# Patient Record
Sex: Male | Born: 1995 | Race: White | Hispanic: No | Smoking: Former smoker
Health system: Southern US, Community
[De-identification: ages and names within clinical notes are randomized; demographics above are authoritative.]

## PROBLEM LIST (undated history)

## (undated) DIAGNOSIS — K000 Anodontia: Secondary | ICD-10-CM

## (undated) DIAGNOSIS — Z8659 Personal history of other mental and behavioral disorders: Secondary | ICD-10-CM

## (undated) HISTORY — DX: Personal history of other mental and behavioral disorders: Z86.59

## (undated) HISTORY — DX: Anodontia: K00.0

## (undated) SURGERY — Surgical Case
Anesthesia: *Unknown

---
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
## 2016-09-29 ENCOUNTER — Encounter (HOSPITAL_COMMUNITY)
Admission: EM | Disposition: A | Discharge status: Home / Self Care | Payer: Self-pay | Source: Home / Self Care | Attending: Emergency Medicine

## 2016-09-29 ENCOUNTER — Emergency Department (HOSPITAL_COMMUNITY): Payer: Self-pay

## 2016-09-29 ENCOUNTER — Emergency Department (HOSPITAL_COMMUNITY): Payer: Self-pay | Admitting: Certified Registered"

## 2016-09-29 ENCOUNTER — Ambulatory Visit (HOSPITAL_COMMUNITY)
Admission: EM | Admit: 2016-09-29 | Discharge: 2016-09-30 | Disposition: A | Discharge status: Home / Self Care | Payer: Self-pay | Attending: Emergency Medicine | Admitting: Emergency Medicine

## 2016-09-29 DIAGNOSIS — S62396A Other fracture of fifth metacarpal bone, right hand, initial encounter for closed fracture: Secondary | ICD-10-CM

## 2016-09-29 DIAGNOSIS — Z91048 Other nonmedicinal substance allergy status: Secondary | ICD-10-CM | POA: Insufficient documentation

## 2016-09-29 DIAGNOSIS — Y9389 Activity, other specified: Secondary | ICD-10-CM | POA: Insufficient documentation

## 2016-09-29 DIAGNOSIS — Y998 Other external cause status: Secondary | ICD-10-CM | POA: Insufficient documentation

## 2016-09-29 DIAGNOSIS — F17200 Nicotine dependence, unspecified, uncomplicated: Secondary | ICD-10-CM | POA: Insufficient documentation

## 2016-09-29 DIAGNOSIS — Z79899 Other long term (current) drug therapy: Secondary | ICD-10-CM | POA: Insufficient documentation

## 2016-09-29 DIAGNOSIS — Y9289 Other specified places as the place of occurrence of the external cause: Secondary | ICD-10-CM | POA: Insufficient documentation

## 2016-09-29 DIAGNOSIS — Z91010 Allergy to peanuts: Secondary | ICD-10-CM | POA: Insufficient documentation

## 2016-09-29 DIAGNOSIS — W2209XA Striking against other stationary object, initial encounter: Secondary | ICD-10-CM | POA: Insufficient documentation

## 2016-09-29 DIAGNOSIS — F909 Attention-deficit hyperactivity disorder, unspecified type: Secondary | ICD-10-CM | POA: Insufficient documentation

## 2016-09-29 DIAGNOSIS — S62324A Displaced fracture of shaft of fourth metacarpal bone, right hand, initial encounter for closed fracture: Secondary | ICD-10-CM | POA: Insufficient documentation

## 2016-09-29 DIAGNOSIS — Z09 Encounter for follow-up examination after completed treatment for conditions other than malignant neoplasm: Secondary | ICD-10-CM

## 2016-09-29 DIAGNOSIS — S62336A Displaced fracture of neck of fifth metacarpal bone, right hand, initial encounter for closed fracture: Secondary | ICD-10-CM | POA: Insufficient documentation

## 2016-09-29 HISTORY — PX: PERCUTANEOUS PINNING: SHX2209

## 2016-09-29 SURGERY — PINNING, EXTREMITY, PERCUTANEOUS
Anesthesia: General | Site: Hand | Laterality: Right

## 2016-09-29 MED ORDER — MIDAZOLAM HCL 2 MG/2ML IJ SOLN
INTRAMUSCULAR | Status: AC
  Filled 2016-09-29: qty 2

## 2016-09-29 MED ORDER — 0.9 % SODIUM CHLORIDE (POUR BTL) OPTIME
TOPICAL | Status: DC | PRN
  Administered 2016-09-29: 1000 mL

## 2016-09-29 MED ORDER — PROPOFOL 10 MG/ML IV BOLUS
INTRAVENOUS | Status: AC
  Filled 2016-09-29: qty 20

## 2016-09-29 MED ORDER — FENTANYL CITRATE (PF) 250 MCG/5ML IJ SOLN
INTRAMUSCULAR | Status: AC
  Filled 2016-09-29: qty 5

## 2016-09-29 MED ORDER — CEFAZOLIN SODIUM-DEXTROSE 2-4 GM/100ML-% IV SOLN
INTRAVENOUS | Status: AC
  Filled 2016-09-29: qty 100

## 2016-09-29 SURGICAL SUPPLY — 31 items
BANDAGE ACE 3X5.8 VEL STRL LF (GAUZE/BANDAGES/DRESSINGS) ×3 IMPLANT
BANDAGE ACE 4X5 VEL STRL LF (GAUZE/BANDAGES/DRESSINGS) ×3 IMPLANT
BLADE CLIPPER SURG (BLADE) IMPLANT
BNDG COHESIVE 4X5 TAN STRL (GAUZE/BANDAGES/DRESSINGS) ×3 IMPLANT
BNDG GAUZE ELAST 4 BULKY (GAUZE/BANDAGES/DRESSINGS) ×3 IMPLANT
CHLORAPREP W/TINT 10.5 ML (MISCELLANEOUS) ×3 IMPLANT
COVER SURGICAL LIGHT HANDLE (MISCELLANEOUS) ×3 IMPLANT
CUFF TOURN SGL QUICK 18 (TOURNIQUET CUFF) ×3 IMPLANT
DRAPE C-ARM 42X120 X-RAY (DRAPES) ×3 IMPLANT
DRAPE SURG 17X11 SM STRL (DRAPES) ×3 IMPLANT
DRESSING DUODERM 4X4 STERILE (GAUZE/BANDAGES/DRESSINGS) ×3 IMPLANT
DRSG ADAPTIC 3X8 NADH LF (GAUZE/BANDAGES/DRESSINGS) ×3 IMPLANT
GAUZE SPONGE 4X4 12PLY STRL (GAUZE/BANDAGES/DRESSINGS) ×3 IMPLANT
GAUZE XEROFORM 1X8 LF (GAUZE/BANDAGES/DRESSINGS) ×3 IMPLANT
GLOVE BIO SURGEON STRL SZ7.5 (GLOVE) ×3 IMPLANT
GLOVE BIOGEL PI IND STRL 8 (GLOVE) ×1 IMPLANT
GLOVE BIOGEL PI INDICATOR 8 (GLOVE) ×2
GOWN STRL REUS W/ TWL LRG LVL3 (GOWN DISPOSABLE) ×1 IMPLANT
GOWN STRL REUS W/TWL LRG LVL3 (GOWN DISPOSABLE) ×2
KIT BASIN OR (CUSTOM PROCEDURE TRAY) ×3 IMPLANT
KWIRE 1.1 (Wire) ×6 IMPLANT
MANIFOLD NEPTUNE II (INSTRUMENTS) ×3 IMPLANT
NS IRRIG 1000ML POUR BTL (IV SOLUTION) ×3 IMPLANT
PACK ORTHO EXTREMITY (CUSTOM PROCEDURE TRAY) ×3 IMPLANT
PAD CAST 4YDX4 CTTN HI CHSV (CAST SUPPLIES) ×1 IMPLANT
PADDING CAST COTTON 4X4 STRL (CAST SUPPLIES) ×2
SOAP 2 % CHG 4 OZ (WOUND CARE) ×3 IMPLANT
SUT CHROMIC 6 0 PS 4 (SUTURE) ×3 IMPLANT
SUT VICRYL RAPIDE 4/0 PS 2 (SUTURE) ×3 IMPLANT
TOWEL OR 17X26 10 PK STRL BLUE (TOWEL DISPOSABLE) ×3 IMPLANT
WATER STERILE IRR 1000ML POUR (IV SOLUTION) IMPLANT
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
## 2016-09-29 NOTE — ED Notes (Signed)
Pt has PMS to right hand 4/10 pain. Pt denies any numbness or tingling.
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
## 2016-09-29 NOTE — Anesthesia Preprocedure Evaluation (Signed)
Anesthesia Evaluation  Patient identified by MRN, date of birth, ID band Patient awake    Reviewed: Allergy & Precautions, NPO status , Patient's Chart, lab work & pertinent test results  Airway Mallampati: II  TM Distance: >3 FB Neck ROM: Full    Dental  (+) Missing, Dental Advisory Given,    Pulmonary neg pulmonary ROS,    breath sounds clear to auscultation       Cardiovascular negative cardio ROS   Rhythm:Regular Rate:Normal     Neuro/Psych negative neurological ROS  negative psych ROS   GI/Hepatic negative GI ROS, Neg liver ROS,   Endo/Other  negative endocrine ROS  Renal/GU negative Renal ROS     Musculoskeletal negative musculoskeletal ROS (+)   Abdominal   Peds  Hematology negative hematology ROS (+)   Anesthesia Other Findings - ADHD  Reproductive/Obstetrics                             Anesthesia Physical Anesthesia Plan  ASA: II and emergent  Anesthesia Plan: General   Post-op Pain Management:    Induction: Intravenous  PONV Risk Score and Plan: 3 and Ondansetron, Dexamethasone, Propofol and Midazolam  Airway Management Planned: Oral ETT  Additional Equipment:   Intra-op Plan:   Post-operative Plan: Extubation in OR  Informed Consent: I have reviewed the patients History and Physical, chart, labs and discussed the procedure including the risks, benefits and alternatives for the proposed anesthesia with the patient or authorized representative who has indicated his/her understanding and acceptance.   Dental advisory given  Plan Discussed with: CRNA  Anesthesia Plan Comments:         Anesthesia Quick Evaluation

## 2016-09-29 NOTE — Discharge Instructions (Addendum)
General Anesthesia, Adult, Care After These instructions provide you with information about caring for yourself after your procedure. Your health care provider may also give you more specific instructions. Your treatment has been planned according to current medical practices, but problems sometimes occur. Call your health care provider if you have any problems or questions after your procedure. What can I expect after the procedure? After the procedure, it is common to have:  Vomiting.  A sore throat.  Mental slowness.  It is common to feel:  Nauseous.  Cold or shivery.  Sleepy.  Tired.  Sore or achy, even in parts of your body where you did not have surgery.  Follow these instructions at home: For at least 24 hours after the procedure:  Do not: ? Participate in activities where you could fall or become injured. ? Drive. ? Use heavy machinery. ? Drink alcohol. ? Take sleeping pills or medicines that cause drowsiness. ? Make important decisions or sign legal documents. ? Take care of children on your own.  Rest. Eating and drinking  If you vomit, drink water, juice, or soup when you can drink without vomiting.  Drink enough fluid to keep your urine clear or pale yellow.  Make sure you have little or no nausea before eating solid foods.  Follow the diet recommended by your health care provider. General instructions  Have a responsible adult stay with you until you are awake and alert.  Return to your normal activities as told by your health care provider. Ask your health care provider what activities are safe for you.  Take over-the-counter and prescription medicines only as told by your health care provider.  If you smoke, do not smoke without supervision.  Keep all follow-up visits as told by your health care provider. This is important. Contact a health care provider if:  You continue to have nausea or vomiting at home, and medicines are not  helpful.  You cannot drink fluids or start eating again.  You cannot urinate after 8-12 hours.  You develop a skin rash.  You have fever.  You have increasing redness at the site of your procedure. Get help right away if:  You have difficulty breathing.  You have chest pain.  You have unexpected bleeding.  You feel that you are having a life-threatening or urgent problem. This information is not intended to replace advice given to you by your health care provider. Make sure you discuss any questions you have with your health care provider. Document Released: 06/08/2000 Document Revised: 08/05/2015 Document Reviewed: 02/14/2015 Elsevier Interactive Patient Education  2018 [employer] Discharge Instructions   You have a dressing with a plaster splint incorporated in it. Move your fingers as much as possible, making a full fist and fully opening the fist. Elevate your hand to reduce pain & swelling of the digits.  Ice over the operative site may be helpful to reduce pain & swelling.  DO NOT USE HEAT. Leave the dressing in place until you return to our office.  You may shower, but keep the bandage clean & dry.  You may drive a car when you are off of prescription pain medications and can safely control your vehicle with both hands.    Please call [phone] during normal business hours or [phone] after hours for any problems. Including the following:  - excessive redness of the incisions - drainage for more than 4 days - fever of more than 101.5 F  *Please note that pain medications will  not be refilled after hours or on weekends.

## 2016-09-30 ENCOUNTER — Encounter (HOSPITAL_COMMUNITY): Payer: Self-pay | Admitting: Orthopedic Surgery

## 2016-09-30 ENCOUNTER — Emergency Department (HOSPITAL_COMMUNITY): Payer: Self-pay

## 2016-09-30 MED ORDER — ONDANSETRON HCL 4 MG/2ML IJ SOLN
INTRAMUSCULAR | Status: AC
  Filled 2016-09-30: qty 4

## 2016-09-30 MED ORDER — DEXAMETHASONE SODIUM PHOSPHATE 10 MG/ML IJ SOLN
INTRAMUSCULAR | Status: DC | PRN
  Administered 2016-09-30: 10 mg via INTRAVENOUS

## 2016-09-30 MED ORDER — LACTATED RINGERS IV SOLN
INTRAVENOUS | Status: DC
Start: 2016-09-30 — End: 2016-09-30

## 2016-09-30 MED ORDER — ONDANSETRON HCL 4 MG/2ML IJ SOLN
INTRAMUSCULAR | Status: DC | PRN
  Administered 2016-09-30: 4 mg via INTRAVENOUS

## 2016-09-30 MED ORDER — PHENYLEPHRINE 40 MCG/ML (10ML) SYRINGE FOR IV PUSH (FOR BLOOD PRESSURE SUPPORT)
PREFILLED_SYRINGE | INTRAVENOUS | Status: AC
  Filled 2016-09-30: qty 20

## 2016-09-30 MED ORDER — PROPOFOL 10 MG/ML IV BOLUS
INTRAVENOUS | Status: DC | PRN
  Administered 2016-09-30: 200 mg via INTRAVENOUS

## 2016-09-30 MED ORDER — HYDROMORPHONE HCL-NACL 0.5-0.9 MG/ML-% IV SOSY: 0.2500 mg | PREFILLED_SYRINGE | INTRAVENOUS | Status: DC | PRN

## 2016-09-30 MED ORDER — MEPERIDINE HCL 50 MG/ML IJ SOLN: 6.2500 mg | INTRAMUSCULAR | Status: DC | PRN

## 2016-09-30 MED ORDER — ACETAMINOPHEN 325 MG PO TABS: 650.0000 mg | ORAL_TABLET | Freq: Four times a day (QID) | ORAL | Status: DC | PRN

## 2016-09-30 MED ORDER — OXYCODONE HCL 5 MG PO TABS: 5.0000 mg | ORAL_TABLET | Freq: Four times a day (QID) | ORAL | 0 refills | Status: DC | PRN

## 2016-09-30 MED ORDER — MIDAZOLAM HCL 2 MG/2ML IJ SOLN
INTRAMUSCULAR | Status: DC | PRN
  Administered 2016-09-29: 2 mg via INTRAVENOUS

## 2016-09-30 MED ORDER — BUPIVACAINE-EPINEPHRINE (PF) 0.5% -1:200000 IJ SOLN
INTRAMUSCULAR | Status: AC
  Filled 2016-09-30: qty 30

## 2016-09-30 MED ORDER — BUPIVACAINE-EPINEPHRINE (PF) 0.5% -1:200000 IJ SOLN
INTRAMUSCULAR | Status: DC | PRN
  Administered 2016-09-30: 10 mL

## 2016-09-30 MED ORDER — PROMETHAZINE HCL 25 MG/ML IJ SOLN: 6.2500 mg | INTRAMUSCULAR | Status: DC | PRN

## 2016-09-30 MED ORDER — SUCCINYLCHOLINE CHLORIDE 200 MG/10ML IV SOSY
PREFILLED_SYRINGE | INTRAVENOUS | Status: DC | PRN
  Administered 2016-09-30: 100 mg via INTRAVENOUS

## 2016-09-30 MED ORDER — EPHEDRINE 5 MG/ML INJ
INTRAVENOUS | Status: AC
  Filled 2016-09-30: qty 10

## 2016-09-30 MED ORDER — LACTATED RINGERS IV SOLN
INTRAVENOUS | Status: DC | PRN
  Administered 2016-09-29: via INTRAVENOUS

## 2016-09-30 MED ORDER — FENTANYL CITRATE (PF) 100 MCG/2ML IJ SOLN
INTRAMUSCULAR | Status: AC
  Filled 2016-09-30: qty 2

## 2016-09-30 MED ORDER — MEPERIDINE HCL 50 MG/ML IJ SOLN
INTRAMUSCULAR | Status: AC
  Filled 2016-09-30: qty 1

## 2016-09-30 MED ORDER — LIDOCAINE 2% (20 MG/ML) 5 ML SYRINGE
INTRAMUSCULAR | Status: DC | PRN
  Administered 2016-09-30: 60 mg via INTRAVENOUS

## 2016-09-30 MED ORDER — FENTANYL CITRATE (PF) 250 MCG/5ML IJ SOLN
INTRAMUSCULAR | Status: DC | PRN
  Administered 2016-09-29 – 2016-09-30 (×2): 100 ug via INTRAVENOUS
  Administered 2016-09-30: 50 ug via INTRAVENOUS

## 2016-09-30 MED ORDER — IBUPROFEN 200 MG PO TABS: 600.0000 mg | ORAL_TABLET | Freq: Four times a day (QID) | ORAL | Status: DC | PRN

## 2016-09-30 MED ORDER — CEFAZOLIN SODIUM-DEXTROSE 2-3 GM-% IV SOLR
INTRAVENOUS | Status: DC | PRN
  Administered 2016-09-30: 2 g via INTRAVENOUS

## 2016-09-30 MED ORDER — LIDOCAINE 2% (20 MG/ML) 5 ML SYRINGE
INTRAMUSCULAR | Status: AC
  Filled 2016-09-30: qty 5

## 2016-09-30 MED ORDER — SUCCINYLCHOLINE CHLORIDE 200 MG/10ML IV SOSY
PREFILLED_SYRINGE | INTRAVENOUS | Status: AC
  Filled 2016-09-30: qty 20

## 2016-09-30 MED ORDER — FENTANYL CITRATE (PF) 100 MCG/2ML IJ SOLN
INTRAMUSCULAR | Status: DC | PRN
  Administered 2016-09-30 (×2): 50 ug via INTRAVENOUS

## 2016-09-30 NOTE — Anesthesia Procedure Notes (Signed)
Procedure Name: Intubation Date/Time: 09/30/2016 12:04 AM Performed by: Minerva EndsMIRARCHI, Shrinika Blatz M Pre-anesthesia Checklist: Patient identified, Emergency Drugs available, Suction available and Patient being monitored Patient Re-evaluated:Patient Re-evaluated prior to induction Oxygen Delivery Method: Circle System Utilized Preoxygenation: Pre-oxygenation with 100% oxygen Induction Type: IV induction Ventilation: Mask ventilation without difficulty Laryngoscope Size: Miller and 2 Grade View: Grade I Tube type: Oral Number of attempts: 1 Airway Equipment and Method: Stylet Placement Confirmation: ETT inserted through vocal cords under direct vision,  positive ETCO2 and breath sounds checked- equal and bilateral Secured at: 22 cm Tube secured with: Tape Dental Injury: Teeth and Oropharynx as per pre-operative assessment  Comments: Smooth IV induction Hollis--- intubation AM CRNA-  Atraumatic--- teeth and mouth as preop--- many missing teeth and front teeth with irregular surfaces--- bilat BS Hollis

## 2016-09-30 NOTE — Op Note (Signed)
09/29/2016 - 09/30/2016  1:06 AM  PATIENT:  Evan Deleon  21 y.o. male  PRE-OPERATIVE DIAGNOSIS:  Displaced fractures of the right fourth and fifth metacarpals  POST-OPERATIVE DIAGNOSIS:  Same  PROCEDURE:   1.  ORIF right fourth metacarpal fracture    2.  CRPP right fifth metacarpal fracture  SURGEON: Cliffton Astersavid A. Janee Mornhompson, MD  PHYSICIAN ASSISTANT: None  ANESTHESIA:  general  SPECIMENS:  None  DRAINS:   None  EBL:  less than 50 mL  PREOPERATIVE INDICATIONS:  Evan Festerristan Kawa is a  21 y.o. male with displaced fractures of the right fourth and fifth metacarpals.  The risks benefits and alternatives were discussed with the patient preoperatively including but not limited to the risks of infection, bleeding, nerve injury, cardiopulmonary complications, the need for revision surgery, among others, and the patient verbalized understanding and consented to proceed.  OPERATIVE IMPLANTS: 0.062 inch K wire, 0.045 inch K wires 2  OPERATIVE PROCEDURE:  After receiving prophylactic antibiotics, the patient was escorted to the operative theatre and placed in a supine position.  General anesthesia was administered.  A surgical "time-out" was performed during which the planned procedure, proposed operative site, and the correct patient identity were compared to the operative consent and agreement confirmed by the circulating nurse according to current facility policy.  Following application of a tourniquet to the operative extremity, the exposed skin was prepped with Chloraprep and draped in the usual sterile fashion.  The tourniquet was never inflated.  Using fluoroscopic guidance, the base of the fourth metacarpal was identified and a small incision made proximal to it.  Spreading dissection was carried out of the dorsal cortex of the fourth metacarpal base where a 0.062 inch K wire was drilled obliquely to gain access to the central canal.  It was then removed, the N clipped and bent into a skid shape  and it was advanced by hand to the fracture site.  Multiple attempts were made at closed reduction and passing of the nail across the fracture site, and they were unsuccessful.  A 1.5 cm incision was made over the fracture site, spreading dissection carried down to it, and a clamp used to control the distal fragment.  In this manner, the nail was passed across the fracture site and into the metacarpal head.  The fracture was impacted to obtain better reduction and proximally it was bent over and clipped.  A small relaxing incision was made and a portion of the original incision closed with a single 4-0 Vicryl Rapide interrupted suture.  The open reduction site was closed similarly.  The fifth metacarpal neck fracture was then reduced in a closed fashion and the reduction was secured with crossed 0.045 inch K wires it exited ulnarly.  These wires were bent over 90.  Final fluoroscopic images were obtained.  Short arm splint dressing was applied with a plaster component both volarly and ulnarly.  Ulnar nerve block at the elbow was performed by me with half percent Marcaine with epinephrine.  He was awakened and taken to the recovery room stable condition, breathing spontaneously.  DISPOSITION: He'll be discharged home with typical instructions, returning in 10-15 days, with new x-rays of the right hand out of splint and likely application of short arm cast.

## 2016-09-30 NOTE — Anesthesia Procedure Notes (Signed)
Date/Time: 09/30/2016 12:59 AM Performed by: Minerva EndsMIRARCHI, Dyquan Minks M Oxygen Delivery Method: Simple face mask Placement Confirmation: breath sounds checked- equal and bilateral and positive ETCO2 Comments: Extubated--- good AW --- OA present and removed--- teeth unchanged--- mask 02 to PACU on O2

## 2016-09-30 NOTE — Transfer of Care (Signed)
Immediate Anesthesia Transfer of Care Note  Patient: Evan Deleon  Procedure(s) Performed: Procedure(s): PERCUTANEOUS PINNING EXTREMITY/RIGHT 4TH AND 5TH METACARPAL (Right)  Patient Location: PACU  Anesthesia Type:General  Level of Consciousness: awake and alert   Airway & Oxygen Therapy: Patient Spontanous Breathing and Patient connected to face mask oxygen  Post-op Assessment: Report given to RN and Post -op Vital signs reviewed and stable  Post vital signs: Reviewed and stable  Last Vitals:  Vitals:   09/29/16 2148  Pulse: 79  Resp: 20  Temp: 36.7 C    Last Pain:  Vitals:   09/29/16 2156  TempSrc:   PainSc: 4          Complications: No apparent anesthesia complications

## 2016-09-30 NOTE — Anesthesia Postprocedure Evaluation (Signed)
Anesthesia Post Note  Patient: Evan Deleon  Procedure(s) Performed: Procedure(s) (LRB): PERCUTANEOUS PINNING EXTREMITY/RIGHT 4TH AND 5TH METACARPAL (Right)     Patient location during evaluation: PACU Anesthesia Type: General Level of consciousness: awake and alert Pain management: pain level controlled Vital Signs Assessment: post-procedure vital signs reviewed and stable Respiratory status: spontaneous breathing, nonlabored ventilation, respiratory function stable and patient connected to nasal cannula oxygen Cardiovascular status: blood pressure returned to baseline and stable Postop Assessment: no signs of nausea or vomiting Anesthetic complications: no    Last Vitals:  Vitals:   09/30/16 0106 09/30/16 0115  BP: 140/70 (!) 145/85  Pulse: (!) 105 (!) 104  Resp: 18 16  Temp: 36.7 C     Last Pain:  Vitals:   09/29/16 2156  TempSrc:   PainSc: 4                  Shelton SilvasKevin D Gaige Fussner

## 2016-12-14 ENCOUNTER — Encounter: Payer: Self-pay | Admitting: Internal Medicine

## 2016-12-14 ENCOUNTER — Ambulatory Visit (INDEPENDENT_AMBULATORY_CARE_PROVIDER_SITE_OTHER): Payer: Self-pay | Admitting: Internal Medicine

## 2016-12-14 VITALS — BP 128/76 | HR 86 | Resp 14 | Ht 66.5 in | Wt 164.0 lb

## 2016-12-14 DIAGNOSIS — Z9189 Other specified personal risk factors, not elsewhere classified: Secondary | ICD-10-CM

## 2016-12-14 DIAGNOSIS — Z8659 Personal history of other mental and behavioral disorders: Secondary | ICD-10-CM | POA: Insufficient documentation

## 2016-12-14 DIAGNOSIS — K000 Anodontia: Secondary | ICD-10-CM

## 2016-12-14 DIAGNOSIS — H5213 Myopia, bilateral: Secondary | ICD-10-CM

## 2016-12-14 HISTORY — DX: Personal history of other mental and behavioral disorders: Z86.59

## 2016-12-14 MED ORDER — CETIRIZINE HCL 10 MG PO TABS: 10.0000 mg | ORAL_TABLET | Freq: Every day | ORAL | 11 refills | Status: DC

## 2016-12-14 NOTE — Patient Instructions (Signed)
Tobacco Cessation:   [id number] or [phone], the former for support and possibly free nicotine patches/gum and support; the latter for [facility] Smoking cessation class. Get rid of all smoking supplies:  Cigarettes, lighters, ashtrays--no stashes just in case at home if you are serious.  For nicotine patches:  Stop smoking anything the day you start the first patch Start with 21 mg patch and reapply new to different area of skin every 24 hours for 30 days. Then 14 mg patch changed every 24 hours for 14 days. Then 7 mg patch changed every 24 hours for 14 days.  

## 2016-12-14 NOTE — Progress Notes (Signed)
   Subjective:    Patient ID: Evan Deleon, male    DOB: [birth year], 21 y.o.   MRN: [id number]  HPI   Here to establish  1.  Dental Issues:  No pain or particular problems.  Just has not been to dentist in years.  No cavities he is aware of.  2.  Myopia:  Has worn glasses for 5 years.  Last check was about 2 years ago.  Feels his vision has changed with his current lenses.  3.  Seasonal allergies:  Currently using Claritin.  Has never used Zyrtec.  Spring is the worst.  No outpatient prescriptions have been marked as taking for the 12/14/16 encounter (Office Visit) with Julieanne Manson, MD.    Allergies  Allergen Reactions  . Peanuts [Peanut Oil] Swelling    Black walnuts Facial swelling  . Pollen Extract Other (See Comments)    sneezing   Past Medical History:  Diagnosis Date  . History of ADHD 12/14/2016  . Oligodontia     Past Surgical History:  Procedure Laterality Date  . PERCUTANEOUS PINNING Right 09/29/2016   Procedure: PERCUTANEOUS PINNING EXTREMITY/RIGHT 4TH AND 5TH METACARPAL;  Surgeon: Mack Hook, MD;  Location: [facility];  Service: Orthopedics;  Laterality: Right;   History reviewed. No pertinent family history.   Social History   Social History  . Marital status: Media planner    Spouse name: [place]  . Number of children: 2  . Years of education: 12   Occupational History  . asst. manager at [employer]    Social History Main Topics  . Smoking status: Current Every Day Smoker    Packs/day: 0.50    Years: 6.00    Types: Cigarettes  . Smokeless tobacco: Never Used  . Alcohol use No  . Drug use: No  . Sexual activity: Yes    Birth control/ protection: Pill   Other Topics Concern  . Not on file   Social History Narrative   Dropped out of [employer] year, but finished GED via [employer]   Lives with girlfriend, their 2 children and his maternal grandmother.       Review of Systems     Objective:   Physical Exam  NAD HEENT:   PERRL, EOMI, TMs pearly gray, throat without injection.  Several teeth missing.  No significant dental decay Throat without injection Neck:  Supple, No adenopathy Chest:  CTA CV:  RRR with normal S1 and S2, No S3, S4 or murmur.  Radial and DP pulses normal and equal LE:  No edema.        Assessment & Plan:  1.  Myopia:  Optometry Referral and eyeglass voucher if new Rx recommended.  2.  Dental care need:  Dental referral.  Has diagnosis of oligodontia.  3.  Tobacco abuse:  Discussed available support and recommended to utilize nicotine patches--info given.

## 2016-12-14 NOTE — Progress Notes (Signed)
LCSW completed new pt screening with pt. Pt reported that he has had some anxiety over the past month, since hurting his hand. He shared that he has had very little appetite over the past month. He shared that he was on Concerta for ADHD most of his life but had to come off it when he turned 22 and lost Medicaid; he reported daily difficulties with concentration. Pt scheduled an appointment for counseling.

## 2016-12-24 ENCOUNTER — Other Ambulatory Visit: Payer: Self-pay | Admitting: Licensed Clinical Social Worker

## 2018-01-22 ENCOUNTER — Encounter (HOSPITAL_COMMUNITY): Payer: Self-pay | Admitting: Emergency Medicine

## 2018-01-22 ENCOUNTER — Other Ambulatory Visit: Payer: Self-pay

## 2018-01-22 ENCOUNTER — Emergency Department (HOSPITAL_COMMUNITY): Payer: Medicaid Other

## 2018-01-22 ENCOUNTER — Emergency Department (HOSPITAL_COMMUNITY)
Admission: EM | Admit: 2018-01-22 | Discharge: 2018-01-22 | Disposition: A | Discharge status: Home / Self Care | Payer: Medicaid Other | Attending: Emergency Medicine | Admitting: Emergency Medicine

## 2018-01-22 DIAGNOSIS — Y999 Unspecified external cause status: Secondary | ICD-10-CM | POA: Insufficient documentation

## 2018-01-22 DIAGNOSIS — Y939 Activity, unspecified: Secondary | ICD-10-CM | POA: Diagnosis not present

## 2018-01-22 DIAGNOSIS — F17210 Nicotine dependence, cigarettes, uncomplicated: Secondary | ICD-10-CM | POA: Insufficient documentation

## 2018-01-22 DIAGNOSIS — S43014A Anterior dislocation of right humerus, initial encounter: Secondary | ICD-10-CM | POA: Diagnosis not present

## 2018-01-22 DIAGNOSIS — F1290 Cannabis use, unspecified, uncomplicated: Secondary | ICD-10-CM | POA: Diagnosis not present

## 2018-01-22 DIAGNOSIS — X500XXA Overexertion from strenuous movement or load, initial encounter: Secondary | ICD-10-CM | POA: Diagnosis not present

## 2018-01-22 DIAGNOSIS — Y929 Unspecified place or not applicable: Secondary | ICD-10-CM | POA: Insufficient documentation

## 2018-01-22 MED ORDER — HYDROMORPHONE HCL 1 MG/ML IJ SOLN
1.0000 mg | Freq: Once | INTRAMUSCULAR | Status: AC
  Administered 2018-01-22: 1 mg via INTRAVENOUS
  Filled 2018-01-22: qty 1

## 2018-01-22 MED ORDER — IBUPROFEN 800 MG PO TABS: 800.0000 mg | ORAL_TABLET | Freq: Three times a day (TID) | ORAL | 0 refills | Status: DC | PRN

## 2018-01-22 NOTE — ED Provider Notes (Signed)
Emergency Department Provider Note   I have reviewed the triage vital signs and the nursing notes.   HISTORY  Chief Complaint Shoulder Pain   HPI Evan Deleon is a 22 y.o. male with PMH of ADHD presents to the emergency department for evaluation of right shoulder pain.  The patient states that his shoulder came out of joint several weeks ago and then spontaneously went back and without intervention.  He had no prior history of shoulder dislocation.  Today he was slinging a laundry basket when the right shoulder became acutely painful and he noticed some shoulder deformity.  He denies any numbness or tingling in the arm.  No pain in the elbow or wrist. Pain is severe and worse with movement.   Past Medical History:  Diagnosis Date  . History of ADHD 12/14/2016  . Oligodontia     Patient Active Problem List   Diagnosis Date Noted  . History of ADHD 12/14/2016  . Oligodontia     Past Surgical History:  Procedure Laterality Date  . PERCUTANEOUS PINNING Right 09/29/2016   Procedure: PERCUTANEOUS PINNING EXTREMITY/RIGHT 4TH AND 5TH METACARPAL;  Surgeon: Mack Hook, MD;  Location: [facility];  Service: Orthopedics;  Laterality: Right;    Allergies Peanuts [peanut oil] and Pollen extract  History reviewed. No pertinent family history.  Social History Social History   Tobacco Use  . Smoking status: Current Every Day Smoker    Packs/day: 0.50    Years: 6.00    Pack years: 3.00    Types: Cigarettes  . Smokeless tobacco: Never Used  Substance Use Topics  . Alcohol use: No  . Drug use: Yes    Types: Marijuana    Review of Systems  Constitutional: No fever/chills Eyes: No visual changes. ENT: No sore throat. Cardiovascular: Denies chest pain. Respiratory: Denies shortness of breath. Gastrointestinal: No abdominal pain.  No nausea, no vomiting.  No diarrhea.  No constipation. Genitourinary: Negative for dysuria. Musculoskeletal: Negative for back pain. Positive  right shoulder pain.  Skin: Negative for rash. Neurological: Negative for headaches, focal weakness or numbness.  10-point ROS otherwise negative.  ____________________________________________   PHYSICAL EXAM:  VITAL SIGNS: ED Triage Vitals  Enc Vitals Group     BP 01/22/18 1226 114/76     Pulse Rate 01/22/18 1226 75     Resp 01/22/18 1226 18     Temp 01/22/18 1226 97.8 F (36.6 C)     Temp src --      SpO2 01/22/18 1226 99 %     Weight 01/22/18 1222 170 lb (77.1 kg)     Pain Score 01/22/18 1222 4   Constitutional: Alert and oriented. Well appearing and in no acute distress. Eyes: Conjunctivae are normal.  Head: Atraumatic. Nose: No congestion/rhinnorhea. Mouth/Throat: Mucous membranes are moist. Neck: No stridor.   Cardiovascular: Normal rate, regular rhythm. Good peripheral circulation. Grossly normal heart sounds.   Respiratory: Normal respiratory effort.  No retractions. Lungs CTAB. Gastrointestinal: Soft and nontender. No distention.  Musculoskeletal: Positive right shoulder deformity and limited ROM. Palpable humeral head anterior to the glenoid fossa.  Neurologic:  Normal speech and language. No gross focal neurologic deficits are appreciated.  Skin:  Skin is warm, dry and intact. No rash noted.  ____________________________________________  RADIOLOGY  Dg Shoulder Right  Result Date: 01/22/2018 CLINICAL DATA:  Post reduction images of the right shoulder. EXAM: RIGHT SHOULDER - 2+ VIEW COMPARISON:  01/22/2018 at 1308 hours FINDINGS: Humeral head has been reduced into anatomic alignment  with the glenoid. No fracture. IMPRESSION: Successful reduction of the dislocated right shoulder. Electronically Signed   By: Amie Portland M.D.   On: 01/22/2018 14:35   Dg Shoulder Right  Result Date: 01/22/2018 CLINICAL DATA:  Right shoulder deformity and dislocation 1 hour prior to presentation. EXAM: RIGHT SHOULDER - 2+ VIEW COMPARISON:  None. FINDINGS: There is anterior  inferior dislocation of the humeral head with respect to the glenoid. No definite Hill-Sachs impaction or bony Bankart injury. IMPRESSION: 1. Anterior shoulder dislocation without appreciable Hill-Sachs impaction. Electronically Signed   By: Gaylyn Rong M.D.   On: 01/22/2018 13:42    ____________________________________________   PROCEDURES  Procedure(s) performed:   Reduction of dislocation Date/Time: 01/22/2018 1:59 PM Performed by: Maia Plan, MD Authorized by: Maia Plan, MD  Consent: Verbal consent obtained. Risks and benefits: risks, benefits and alternatives were discussed Consent given by: patient Required items: required blood products, implants, devices, and special equipment available Patient identity confirmed: verbally with patient Time out: Immediately prior to procedure a "time out" was called to verify the correct patient, procedure, equipment, support staff and site/side marked as required. Local anesthesia used: no  Anesthesia: Local anesthesia used: no  Sedation: Patient sedated: no  Patient tolerance: Patient tolerated the procedure well with no immediate complications Comments: Patient sitting upright in bed with counter traction placed with sheet. Steady downward pressure applied to the flexed elbow with steady external rotation. The humeral head was palpated re-locating into the glenoid fossa with resolution of pain and deformity. Patient with easier ROM after reduction.       ____________________________________________   INITIAL IMPRESSION / ASSESSMENT AND PLAN / ED COURSE  Pertinent labs & imaging results that were available during my care of the patient were reviewed by me and considered in my medical decision making (see chart for details).  Patient presents to the ED with shoulder dislocation. He was given Dilaudid for pain and tolerated manipulation of the shoulder well. We were able to reduce the right shoulder in the ED without  sedation. Normal neurovascular exam both before and after procedure. Pre and Post-reduction films reviewed. Sling applied and patient to f/u with Ortho.   At this time, I do not feel there is any life-threatening condition present. I have reviewed and discussed all results (EKG, imaging, lab, urine as appropriate), exam findings with patient. I have reviewed nursing notes and appropriate previous records.  I feel the patient is safe to be discharged home without further emergent workup. Discussed usual and customary return precautions. Patient and family (if present) verbalize understanding and are comfortable with this plan.  Patient will follow-up with their primary care provider. If they do not have a primary care provider, information for follow-up has been provided to them. All questions have been answered.  ____________________________________________  FINAL CLINICAL IMPRESSION(S) / ED DIAGNOSES  Final diagnoses:  Anterior dislocation of right shoulder, initial encounter    MEDICATIONS GIVEN DURING THIS VISIT:  Medications  HYDROmorphone (DILAUDID) injection 1 mg (1 mg Intravenous Given 01/22/18 1342)     NEW OUTPATIENT MEDICATIONS STARTED DURING THIS VISIT:  Discharge Medication List as of 01/22/2018  3:03 PM    START taking these medications   Details  ibuprofen (ADVIL,MOTRIN) 800 MG tablet Take 1 tablet (800 mg total) by mouth every 8 (eight) hours as needed for moderate pain., Starting Sat 01/22/2018, Print        Note:  This document was prepared using Dragon voice recognition software and may  include unintentional dictation errors.  Alona Bene, MD Emergency Medicine    , Arlyss Repress, MD 01/23/18 [phone]

## 2018-01-22 NOTE — ED Triage Notes (Signed)
Pt stated that he threw a heavy object with his r/arm and it dislocated his r/shoulder. Obvious deformity noted.  Incident occured approx 30 minutes ago. Pt reports a similar incident one month ago and was able to pop it back in to place. He did not seek medical attention for that event. Friend applied ice to shoulder and transported him to ED

## 2018-01-22 NOTE — ED Provider Notes (Signed)
MSE was initiated and I personally evaluated the patient and placed orders (if any) at  1:00 PM on January 22, 2018.  22 year old male presents for evaluation of right arm injury.  States he threw a Government social research officer and felt a "pop" to his right shoulder.  Patient states he does have a history of dislocating his right shoulder.  Last dislocation approximately 1 month ago.  Injury occurred at approximately 12 PM today.  Rates his pain a 10/10.  States he is unable to move the arm secondary to pain.   General: Well-appearing male.  Appears in obvious discomfort. Cardiac: Normal rate and rhythm, no rubs murmurs or gallops. 2+ bilateral radial pulses. Pulmonary: Clear to auscultation bilaterally. MS: Unable to perform ROM secondary to pain. 5/5 grip strength to bilateral upper extremity. Neuro: Intact sensation to bilateral upper extremities. Skin: Mild edema to right shoulder.  No erythema or warmth to right upper extremity.  MDM:  Patient with obvious squared off right shoulder.  Concern for dislocation.  Neurovascularly intact.  2+ radial pulses bilaterally.  Will obtain plain film and provide pain management.  The patient appears stable so that the remainder of the MSE may be completed by another provider.   Murle Hellstrom A, PA-C 01/22/18 1331    Geoffery Lyons, MD 01/22/18 1504

## 2018-01-22 NOTE — ED Notes (Signed)
I only discharged pt from lobby. 

## 2018-01-22 NOTE — ED Notes (Signed)
Bed: WA21 Expected date:  Expected time:  Means of arrival:  Comments: Triage 7 

## 2018-01-22 NOTE — Discharge Instructions (Signed)
You have been seen in the Emergency Department (ED) today for a shoulder dislocation.  It was put back in place in the ED.  Please follow up as written with the recommend orthopedic surgeon.  It is important you leave the shoulder immobilizer in place until they tell you to remove it.  If you have worsening pain or swelling, if the shoulder dislocates again, or if you have any other symptoms that concern you, please return immediately to the Emergency Department.  

## 2018-01-24 ENCOUNTER — Ambulatory Visit (INDEPENDENT_AMBULATORY_CARE_PROVIDER_SITE_OTHER): Payer: Medicaid Other | Admitting: Family Medicine

## 2018-01-24 ENCOUNTER — Encounter (INDEPENDENT_AMBULATORY_CARE_PROVIDER_SITE_OTHER): Payer: Self-pay | Admitting: Family Medicine

## 2018-01-24 DIAGNOSIS — S43014A Anterior dislocation of right humerus, initial encounter: Secondary | ICD-10-CM

## 2018-01-24 DIAGNOSIS — M25511 Pain in right shoulder: Secondary | ICD-10-CM | POA: Diagnosis not present

## 2018-01-24 NOTE — Progress Notes (Signed)
Office Visit Note   Patient: Evan Deleon           Date of Birth: 1996/01/06           MRN: [id number] Visit Date: 01/24/2018 Requested by: Julieanne Manson, MD [address] [place], [place] [address] PCP: Julieanne Manson, MD  Subjective: Chief Complaint  Patient presents with  . Right Shoulder - Pain    Dislocated right shoulder picking up a laundry basket and lifting it over the heads of his children and sliding it across the floor - 01/22/18.  Went to [facility] ED - in [place] throughout day, off at night.  Occurred 12/25/17 also, but "it fixed itself."    HPI: He is a 22 year old right-hand-dominant male with right shoulder dislocation.  2 days ago he lifted a laundry basket overhead and then set it down to the left.  He felt his shoulder slip out of place and it would not come back in.  He went to the ER where he underwent closed reduction under light anesthesia.  He has been in a shoulder sling since then, feels very well, hardly having any discomfort.  About a month before he recalls throwing a ball and feeling like his shoulder was slipping out of place, but it never came out completely.  Prior to that, he never had any troubles with his shoulder.  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  He denies any numbness or tingling in his arm today.              ROS: Otherwise noncontributory  Objective: Vital Signs: There were no vitals taken for this visit.  Physical Exam:  Right shoulder: Shoulder appears symmetric compared to the left, palpation is symmetric as well.  He has no significant pain to palpation today.  No numbness in the deltoid area or in his wrist and hand.  Neurovascularly intact in the upper extremity.  Active range of motion is almost full.  Imaging: None today.  Hospital x-rays were reviewed on computer.  Assessment & Plan: 1.  2 days status post closed reduction of right shoulder anterior glenohumeral dislocation -We discussed the  possibility of recurrent dislocation.  He is on the borderline of age, and because he is feeling well today he would like to try conservative treatment rather than surgical approach.  We will put him in physical therapy.  Cautious range of motion.  As long as he regains good strength and does not have any further dislocation events, he will follow-up as needed.   Follow-Up Instructions: Return in about 4 weeks (around 02/21/2018), or if symptoms worsen or fail to improve.      Procedures: No procedures performed  No notes on file    PMFS History: Patient Active Problem List   Diagnosis Date Noted  . History of ADHD 12/14/2016  . Oligodontia    Past Medical History:  Diagnosis Date  . History of ADHD 12/14/2016  . Oligodontia     No family history on file.  Past Surgical History:  Procedure Laterality Date  . PERCUTANEOUS PINNING Right 09/29/2016   Procedure: PERCUTANEOUS PINNING EXTREMITY/RIGHT 4TH AND 5TH METACARPAL;  Surgeon: Mack Hook, MD;  Location: [facility];  Service: Orthopedics;  Laterality: Right;   Social History   Occupational History  . Occupation: asst. Production designer, theatre/television/film at [employer]  . Smoking status: Current Every Day Smoker    Packs/day: 0.50    Years: 6.00  Pack years: 3.00    Types: Cigarettes  . Smokeless tobacco: Never Used  Substance and Sexual Activity  . Alcohol use: No  . Drug use: Yes    Types: Marijuana  . Sexual activity: Yes    Comment: girlfriend uses a pillk

## 2018-01-26 ENCOUNTER — Ambulatory Visit: Payer: Medicaid Other | Attending: Family Medicine

## 2018-01-26 ENCOUNTER — Other Ambulatory Visit: Payer: Self-pay

## 2018-01-26 ENCOUNTER — Ambulatory Visit: Payer: Medicaid Other | Admitting: Family Medicine

## 2018-01-26 DIAGNOSIS — M6281 Muscle weakness (generalized): Secondary | ICD-10-CM | POA: Diagnosis present

## 2018-01-26 DIAGNOSIS — M25611 Stiffness of right shoulder, not elsewhere classified: Secondary | ICD-10-CM | POA: Diagnosis present

## 2018-01-26 DIAGNOSIS — M25511 Pain in right shoulder: Secondary | ICD-10-CM | POA: Insufficient documentation

## 2018-01-26 NOTE — Therapy (Signed)
[facility] [address] [place], [place], [address] Phone: [phone]   Fax:  [phone]  Physical Therapy Evaluation  Patient Details  Name: Evan Deleon MRN: [id number] Date of Birth: 09/16/1995 Referring Provider (PT): Lavada MesiMichael Hilts, MD   Encounter Date: 01/26/2018  PT End of Session - 01/26/18 1316    Visit Number  1    Number of Visits  12    Date for PT Re-Evaluation  03/18/18    Authorization Type  MCD    PT Start Time  0110    PT Stop Time  0150    PT Time Calculation (min)  40 min    Activity Tolerance  Patient tolerated treatment well    Behavior During Therapy  [facility] for tasks assessed/performed       Past Medical History:  Diagnosis Date  . History of ADHD 12/14/2016  . Oligodontia     Past Surgical History:  Procedure Laterality Date  . PERCUTANEOUS PINNING Right 09/29/2016   Procedure: PERCUTANEOUS PINNING EXTREMITY/RIGHT 4TH AND 5TH METACARPAL;  Surgeon: Mack Hookhompson, David, MD;  Location: [facility];  Service: Orthopedics;  Laterality: Right;    There were no vitals filed for this visit.   Subjective Assessment - 01/26/18 1315    Subjective  RT shoulder dislocation with lifting laundry basket over children and shoulder popped out . Happened a month prior but did not stay dislocated.   MD sid can be out of sling with pendulums.  Does 5x/day.     Limitations  House hold activities;Lifting   lifting at work   Diagnostic tests  x-ray    Patient Stated Goals  to see if have any strength left.     Currently in Pain?  No/denies         [facility] - 01/26/18 0001      Assessment   Medical Diagnosis  right shoulder dislocation    Referring Provider (PT)  Lavada MesiMichael Hilts, MD    Onset Date/Surgical Date  01/22/18    Hand Dominance  Right    Next MD Visit  4 weeks    Prior Therapy  no      Precautions   Precautions  Shoulder    Type of Shoulder Precautions  no pain  or forced ROM    Required Braces  or Orthoses  Sling   as needed     Restrictions   Weight Bearing Restrictions  Yes    RUE Weight Bearing  Weight bearing as tolerated      Balance Screen   Has the patient fallen in the past 6 months  No      Prior Function   Level of Independence  Needs assistance with homemaking    Vocation  Full time employment    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      Cognition   Overall Cognitive Status  Within Functional Limits for tasks assessed      ROM / Strength   AROM / PROM / Strength  AROM;Strength      AROM   AROM Assessment Site  Shoulder    Right/Left Shoulder  Right    Right Shoulder Extension  52 Degrees    Right Shoulder Flexion  125 Degrees    Right Shoulder ABduction  84 Degrees    Right Shoulder Internal Rotation  40 Degrees    Right Shoulder External Rotation  55 Degrees      Strength   Strength Assessment  Site  Shoulder    Right/Left Shoulder  Right    Right Shoulder Flexion  4+/5    Right Shoulder Extension  5/5    Right Shoulder ABduction  5/5    Right Shoulder Internal Rotation  5/5    Right Shoulder External Rotation  5/5    Right Shoulder Horizontal ABduction  5/5    Right Shoulder Horizontal ADduction  5/5                Objective measurements completed on examination: See above findings.      [facility] Adult PT Treatment/Exercise - 01/26/18 0001      Exercises   Exercises  Shoulder      Shoulder Exercises: Isometric Strengthening   Flexion  3X5"    Extension  3X5"    External Rotation  3X5"    Internal Rotation  3X5"    ABduction  3X5"             PT Education - 01/26/18 1352    Education Details  POC , isometrics and scapula retraction for HEP    Person(s) Educated  Patient    Methods  Explanation;Demonstration;Tactile cues;Verbal cues;Handout    Comprehension  Returned demonstration;Verbalized understanding       PT Short Term Goals - 01/26/18 1318      PT SHORT TERM GOAL #1   Title  He will be  indpendent with initial HEP    Baseline  No program    Time  2    Period  Weeks    Status  New      PT SHORT TERM GOAL #2   Title  He will increase active flexion RT shoulder to 140 degrees     Baseline  125 degrees at eval    Time  2    Period  Weeks    Status  New      PT SHORT TERM GOAL #3   Title  He will have  120 degrees active flexion     Baseline  85 degrees at eval    Time  2    Period  Weeks    Status  New        PT Long Term Goals - 01/26/18 1349      PT LONG TERM GOAL #1   Title  He will have full active RT shoulder ROM without pain to return to work    Baseline  ROM limited at eval    Time  8    Period  Weeks    Status  New      PT LONG TERM GOAL #2   Title  He will have normal strength and able to lift 50 pounds from floor to be able to RTW    Baseline  Not able to lift any weight with RT arm at eval     Time  8    Period  Weeks    Status  New      PT LONG TERM GOAL #3   Title  No use of sling     Baseline  using 75% of day at eval    Time  8    Period  Weeks    Status  New      PT LONG TERM GOAL #4   Title  He will report using RT arm for normal home tasks without pain    Baseline  not lifting laundry or other items at home.     Time  8    Period  Weeks    Status  New             Plan - 01/26/18 1316    Clinical Impression Statement  [name] is post RT shoulder dislocation and now with minimal pain and decr strength/ROM limits use of RT arm for normal activity. He is not working now. As long as he is cautious he should progress well with PT.     History and Personal Factors relevant to plan of care:  second dislocation in a month    Clinical Presentation  Stable    Clinical Presentation due to:  recent RT shoulder dislocation    Clinical Decision Making  Low    Clinical Impairments Affecting Rehab Potential  2 dislocations in a month    PT Frequency  --   3 visits   PT Duration  2 weeks   then 2x/week for 4 weeks after   PT  Treatment/Interventions  Passive range of motion;Manual techniques;Patient/family education;Therapeutic activities;Therapeutic exercise;Cryotherapy;Taping    PT Next Visit Plan  review isometrics, gentle progression of ROM and strength    PT Home Exercise Plan  isometrics .    Consulted and Agree with Plan of Care  Patient       Patient will benefit from skilled therapeutic intervention in order to improve the following deficits and impairments:  Pain, Impaired UE functional use, Decreased range of motion, Decreased activity tolerance, Decreased strength  Visit Diagnosis: Right shoulder pain, unspecified chronicity  Muscle weakness (generalized)  Stiffness of right shoulder, not elsewhere classified     Problem List Patient Active Problem List   Diagnosis Date Noted  . History of ADHD 12/14/2016  . Jenne Pane PT  01/26/2018, 1:57 PM  [facility] [address] [place], [place], [address] Phone: [phone]   Fax:  [phone]  Name: Evan Deleon MRN: [id number] Date of Birth: Apr 28, 1995

## 2018-01-26 NOTE — Patient Instructions (Addendum)
Strengthening: Isometric Flexion  Using wall for resistance, press right fist into ball using light pressure. Hold ____ seconds. Repeat ____ times per set. Do ____ sets per session. Do ____ sessions per day.  SHOULDER: Abduction (Isometric)  Use wall as resistance. Press arm against pillow. Keep elbow straight. Hold ___ seconds. ___ reps per set, ___ sets per day, ___ days per week  Extension (Isometric)  Place left bent elbow and back of arm against wall. Press elbow against wall. Hold ____ seconds. Repeat ____ times. Do ____ sessions per day.  Internal Rotation (Isometric)  Place palm of right fist against door frame, with elbow bent. Press fist against door frame. Hold ____ seconds. Repeat ____ times. Do ____ sessions per day.  External Rotation (Isometric)  Place back of left fist against door frame, with elbow bent. Press fist against door frame. Hold ____ seconds. Repeat ____ times. Do ____ sessions per day.  Copyright  VHI. All rights reserved.   DO ALL            2X/DAY                10 REPS                  5-10 SEC HOLD                                                                                                                                                                                     Scapula retraction and depression 2-3 reps every hour

## 2018-02-02 ENCOUNTER — Ambulatory Visit: Payer: Medicaid Other | Admitting: Rehabilitative and Restorative Service Providers"

## 2018-02-02 ENCOUNTER — Encounter: Payer: Self-pay | Admitting: Rehabilitative and Restorative Service Providers"

## 2018-02-02 DIAGNOSIS — M6281 Muscle weakness (generalized): Secondary | ICD-10-CM

## 2018-02-02 DIAGNOSIS — M25611 Stiffness of right shoulder, not elsewhere classified: Secondary | ICD-10-CM

## 2018-02-02 DIAGNOSIS — M25511 Pain in right shoulder: Secondary | ICD-10-CM | POA: Diagnosis not present

## 2018-02-02 NOTE — Therapy (Signed)
[facility] [address] [place], [place], [address] Phone: [phone]   Fax:  [phone]  Physical Therapy Treatment  Patient Details  Name: Evan Deleon MRN: [id number] Date of Birth: 1996-02-22 Referring Provider (PT): Lavada Mesi, MD   Encounter Date: 02/02/2018  PT End of Session - 02/02/18 1542    Visit Number  2    Number of Visits  12    Date for PT Re-Evaluation  03/18/18    Authorization Type  MCD    PT Start Time  1542    PT Stop Time  1613    PT Time Calculation (min)  31 min    Activity Tolerance  Patient tolerated treatment well       Past Medical History:  Diagnosis Date  . History of ADHD 12/14/2016  . Oligodontia     Past Surgical History:  Procedure Laterality Date  . PERCUTANEOUS PINNING Right 09/29/2016   Procedure: PERCUTANEOUS PINNING EXTREMITY/RIGHT 4TH AND 5TH METACARPAL;  Surgeon: Mack Hook, MD;  Location: [facility];  Service: Orthopedics;  Laterality: Right;    There were no vitals filed for this visit.  Subjective Assessment - 02/02/18 1543    Subjective  Shoulder is feeling much better - no pain. He has been doing exercises at home and returned to work Monday. 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 He has done OK with return to work.     Currently in Pain?  No/denies         [facility] PT Assessment - 02/02/18 0001      Assessment   Medical Diagnosis  right shoulder dislocation    Referring Provider (PT)  Lavada Mesi, MD    Onset Date/Surgical Date  01/22/18    Hand Dominance  Right    Next MD Visit  4 weeks    Prior Therapy  no      AROM   Right Shoulder Extension  65 Degrees    Right Shoulder Flexion  159 Degrees    Right Shoulder ABduction  160 Degrees    Right Shoulder External Rotation  90 Degrees      Strength   Right Shoulder Flexion  --   5-/5                  [facility] Adult  PT Treatment/Exercise - 02/02/18 0001      Exercises   Exercises  Shoulder   focus on position of scapulae on thoracic spine      Shoulder Exercises: Prone   Other Prone Exercises  prone extension 2-3 sec x 10 working on pulling scapulae down and back     Other Prone Exercises  W's in prone x 5; superman x 5       Shoulder Exercises: Standing   Extension  Strengthening;Both;10 reps;Theraband    Theraband Level (Shoulder Extension)  Level 2 (Red)    Row  Strengthening;Both;10 reps;Theraband    Theraband Level (Shoulder Row)  Level 2 (Red)    Retraction  Strengthening;Both;10 reps    Theraband Level (Shoulder Retraction)  Level 2 (Red)    Other Standing Exercises  scap squeeze 10 sec x 10 with noodle; L's  x 10; W's x 10       Shoulder Exercises: Isometric Strengthening   Flexion  3X5"    Extension  3X5"    External Rotation  3X5"    Internal  Rotation  3X5"    ABduction  3X5"               PT Short Term Goals - 01/26/18 1318      PT SHORT TERM GOAL #1   Title  He will be indpendent with initial HEP    Baseline  No program    Time  2    Period  Weeks    Status  New      PT SHORT TERM GOAL #2   Title  He will increase active flexion RT shoulder to 140 degrees     Baseline  125 degrees at eval    Time  2    Period  Weeks    Status  New      PT SHORT TERM GOAL #3   Title  He will have  120 degrees active flexion     Baseline  85 degrees at eval    Time  2    Period  Weeks    Status  New        PT Long Term Goals - 01/26/18 1349      PT LONG TERM GOAL #1   Title  He will have full active RT shoulder ROM without pain to return to work    Baseline  ROM limited at eval    Time  8    Period  Weeks    Status  New      PT LONG TERM GOAL #2   Title  He will have normal strength and able to lift 50 pounds from floor to be able to RTW    Baseline  Not able to lift any weight with RT arm at eval     Time  8    Period  Weeks    Status  New      PT LONG TERM  GOAL #3   Title  No use of sling     Baseline  using 75% of day at eval    Time  8    Period  Weeks    Status  New      PT LONG TERM GOAL #4   Title  He will report using RT arm for normal home tasks without pain    Baseline  not lifting laundry or other items at home.     Time  8    Period  Weeks    Status  New            Plan - 02/02/18 1543    Clinical Impression Statement  Patient returns reporting significant improvement in shoulder function. He has no pain and returned to work Monday. Discussed the importance of posture and alignment as well as posterior shoulder girdle strnegthening to prevent recurrent problems. Patient added exercise without difficulty  - reported fatigue. Patient demonstrates good gains in Rt shoudler ROM. Will benefit from continued HPE and rehab to further strrengthen shoulder girdle/shoulder.     Clinical Impairments Affecting Rehab Potential  2 dislocations in a month    PT Frequency  --   3 visits   PT Duration  2 weeks   then 2x/week for 4 weeks after   PT Treatment/Interventions  Passive range of motion;Manual techniques;Patient/family education;Therapeutic activities;Therapeutic exercise;Cryotherapy;Taping    PT Next Visit Plan  progression of ROM and strength    PT Home Exercise Plan  isometrics .    Consulted and Agree with Plan of Care  Patient  Patient will benefit from skilled therapeutic intervention in order to improve the following deficits and impairments:  Pain, Impaired UE functional use, Decreased range of motion, Decreased activity tolerance, Decreased strength  Visit Diagnosis: Right shoulder pain, unspecified chronicity  Muscle weakness (generalized)  Stiffness of right shoulder, not elsewhere classified     Problem List Patient Active Problem List   Diagnosis Date Noted  . History of ADHD 12/14/2016  . Oligodontia     Lylah Lantis Rober MinionP Miniya Miguez PT, MPH  02/02/2018, 4:22 PM  [facility] [address] [place], [place], [address] Phone: [phone]   Fax:  [phone]  Name: Evan Festerristan Deleon MRN: [id number] Date of Birth: 03/17/1995

## 2018-02-02 NOTE — Patient Instructions (Addendum)
  Shoulder Blade Squeeze    Rotate shoulders back, then squeeze shoulder blades down and back - can use noodle  Hold 10 sec Repeat __10__ times. Do ___several _ sessions per day.  Upper Back Strength: Lower Trapezius / Rotator Cuff " L's "     Arms in waitress pose, palms up. Press hands back and slide shoulder blades down. Hold for __5__ seconds. Repeat _10___ times. 1-2 times per day.    Scapular Retraction: Elbow Flexion (Standing)  "W's"     With elbows bent to 90, pinch shoulder blades together and rotate arms out, keeping elbows bent. Repeat __10__ times per set. Do __1-2__ sets per session. Do _several ___ sessions per day.   Resisted External Rotation: in Neutral - Bilateral   PALMS UP Sit or stand, tubing in both hands, elbows at sides, bent to 90, forearms forward. Pinch shoulder blades together and rotate forearms out. Keep elbows at sides. Repeat __10__ times per set. Do _2-3___ sets per session. Do _2-3___ sessions per day.     Low Row: Standing   Face anchor, feet shoulder width apart. Palms up, pull arms back, squeezing shoulder blades together. Repeat 10__ times per set. Do 2-3__ sets per session. Do 1-2_ sessions perday  Strengthening: Resisted Extension   Hold tubing in right hand, arm forward. Pull arm back, elbow straight. Repeat _10___ times per set. Do 2-3____ sets per session. Do 1-2____ sessions per day.   All exercises on stomach 5-10 reps hold 2-3 sec   Shoulder Blade Squeeze: Arms at Sides    Arms at sides, parallel, elbows straight, palms up. Press pelvis down. Squeeze backbone with shoulder blades, raising front of shoulders, chest, and arms. Keep head and neck neutral. Hold ___ seconds. Relax. Repeat ___ times.   Shoulder Blade Squeeze: W    Arms out to sides at 90 palms down. Bend elbows to 90. Press pelvis down. Squeeze backbone with shoulder blades. Raise arms, front of shoulders, chest, and head. Keep neck neutral. Hold  ___ seconds. Relax. Repeat ___ times.   Shoulder Blade Squeeze: Airplane    Arms out to sides at 90, elbows straight, palms down. Press pelvis down. Squeeze backbone with shoulder blades. Raise arms, front of shoulders, chest, and head. Keep neck neutral. Hold ___ seconds. Relax. Repeat ___ times.   Shoulder Blade Squeeze: Superperson    Arms alongside head, elbows straight, palms down. Press pelvis down. Squeeze backbone with shoulder blades. Raise arms, chest, and head. Keep neck neutral. Hold ___ seconds. Relax. Repeat ___ times.

## 2018-02-08 ENCOUNTER — Ambulatory Visit: Payer: Medicaid Other

## 2018-02-08 DIAGNOSIS — M25611 Stiffness of right shoulder, not elsewhere classified: Secondary | ICD-10-CM

## 2018-02-08 DIAGNOSIS — M25511 Pain in right shoulder: Secondary | ICD-10-CM

## 2018-02-08 DIAGNOSIS — M6281 Muscle weakness (generalized): Secondary | ICD-10-CM

## 2018-02-08 NOTE — Patient Instructions (Signed)
Issued green and blue band ,      Added quadra ped and ball circles on wall for HEP with caution to stop if tired or incr soreness.   Daily x 10-20 reps

## 2018-02-08 NOTE — Therapy (Addendum)
[place], [place], [address] Phone: [phone]   Fax:  [phone]  Physical Therapy Treatment/Discharge  Patient Details  Name: Evan Deleon MRN: [id number] Date of Birth: June 20, 1995 Referring Provider (PT): Eunice Blase, MD   Encounter Date: 02/08/2018  PT End of Session - 02/08/18 1507    Visit Number  3    Number of Visits  12    Date for PT Re-Evaluation  03/18/18    Authorization Type  MCD    Authorization Time Period  11/18 to 02/14/18    PT Start Time  0307    PT Stop Time  0345    PT Time Calculation (min)  38 min    Activity Tolerance  Patient tolerated treatment well    Behavior During Therapy  [facility] for tasks assessed/performed       Past Medical History:  Diagnosis Date  . History of ADHD 12/14/2016  . Oligodontia     Past Surgical History:  Procedure Laterality Date  . PERCUTANEOUS PINNING Right 09/29/2016   Procedure: PERCUTANEOUS PINNING EXTREMITY/RIGHT 4TH AND 5TH METACARPAL;  Surgeon: Milly Jakob, MD;  Location: [facility];  Service: Orthopedics;  Laterality: Right;    There were no vitals filed for this visit.  Subjective Assessment - 02/08/18 1508    Subjective  He reports no limits at work and no pain.     Currently in Pain?  No/denies                       [facility] Adult PT Treatment/Exercise - 02/08/18 0001      Exercises   Exercises  Shoulder      Shoulder Exercises: Prone   Other Prone Exercises  prone extension 2-3 sec x 10 working on pulling scapulae down and back  Quadrapand slides  RT and LT flex and abduct 2x5 RT ? LT  Then stand and ball circles x 20 reps clock and clockwise.     Other Prone Exercises  W's in prone x 5; superman x 5 and extension with 2 pounds 2 sets      Shoulder Exercises: Standing   Horizontal ABduction  Both;15 reps;Theraband    Theraband Level (Shoulder Horizontal ABduction)  Level 2 (Red)    External Rotation  Right;15  reps    Theraband Level (Shoulder External Rotation)  Level 2 (Red)    Extension  Right;15 reps    Theraband Level (Shoulder Extension)  Level 2 (Red)    Row  Right;15 reps    Theraband Level (Shoulder Row)  Level 2 (Red)    Retraction  Both;15 reps    Theraband Level (Shoulder Retraction)  Level 2 (Red)    Other Standing Exercises  weight bearing on counter with weight shifting RT to LT       Shoulder Exercises: ROM/Strengthening   UBE (Upper Arm Bike)  L3 3 min forward and 3 back      Shoulder Exercises: Body Blade   Other Body Blade Exercises  flexion and abduction x 30-45 sec             PT Education - 02/08/18 1546    Education Details  HEP    Person(s) Educated  Patient    Methods  Explanation;Demonstration;Tactile cues;Verbal cues;Handout    Comprehension  Returned demonstration;Verbalized understanding       PT Short Term Goals - 02/08/18 1547      PT SHORT TERM GOAL #1   Title  He will be indpendent with initial HEP    Status  Achieved      PT SHORT TERM GOAL #2   Title  He will increase active flexion RT shoulder to 140 degrees     Status  Achieved      PT SHORT TERM GOAL #3   Title  He will have  120 degrees active flexion     Status  Achieved        PT Long Term Goals - 01/26/18 1349      PT LONG TERM GOAL #1   Title  He will have full active RT shoulder ROM without pain to return to work    Baseline  ROM limited at eval    Time  8    Period  Weeks    Status  New      PT LONG TERM GOAL #2   Title  He will have normal strength and able to lift 50 pounds from floor to be able to RTW    Baseline  Not able to lift any weight with RT arm at eval     Time  8    Period  Weeks    Status  New      PT LONG TERM GOAL #3   Title  No use of sling     Baseline  using 75% of day at eval    Time  8    Period  Weeks    Status  New      PT LONG TERM GOAL #4   Title  He will report using RT arm for normal home tasks without pain    Baseline  not  lifting laundry or other items at home.     Time  8    Period  Weeks    Status  New            Plan - 02/08/18 1508    Clinical Impression Statement  Evan Deleon is doing exceptionally well with no pain and full function. He only needs  Good HEP  and can be discharged    PT Treatment/Interventions  Passive range of motion;Manual techniques;Patient/family education;Therapeutic activities;Therapeutic exercise;Cryotherapy;Taping    PT Next Visit Plan  progression of ROM and strength    PT Home Exercise Plan  isometrics . band exer [place]  red band and issued green and blue     Consulted and Agree with Plan of Care  Patient       Patient will benefit from skilled therapeutic intervention in order to improve the following deficits and impairments:  Pain, Impaired UE functional use, Decreased range of motion, Decreased activity tolerance, Decreased strength  Visit Diagnosis: Right shoulder pain, unspecified chronicity  Muscle weakness (generalized)  Stiffness of right shoulder, not elsewhere classified     Problem List Patient Active Problem List   Diagnosis Date Noted  . History of ADHD 12/14/2016  . Skip Mayer  PT 02/08/2018, 3:47 PM  [facility] [address] [place], [place], [address] Phone: [phone]   Fax:  [phone]  Name: Evan Deleon MRN: [id number] Date of Birth: 1995/08/19  PHYSICAL THERAPY DISCHARGE SUMMARY  Visits from Start of Care: 3  Current functional level related to goals / functional outcomes: See above He called to cancel as he feels he is doing better   Remaining deficits: See above   Education / Equipment: HEP Plan: Patient agrees to discharge.  Patient  goals were met. Patient is being discharged due to being pleased with the current functional level.  ?????  Pearson Forster PT   02/24/18

## 2018-02-14 ENCOUNTER — Telehealth: Payer: Self-pay | Admitting: Physical Therapy

## 2018-02-14 ENCOUNTER — Ambulatory Visit: Payer: Medicaid Other | Attending: Family Medicine | Admitting: Physical Therapy

## 2018-02-14 NOTE — Telephone Encounter (Signed)
Pt no show for PT appointment today. They where contacted and informed of this by voicemail. They where given number to call front office to reschedule.  Ivery QualeBrian Geneen Dieter, PT, DPT 02/14/18 4:38 PM

## 2018-02-21 ENCOUNTER — Ambulatory Visit (INDEPENDENT_AMBULATORY_CARE_PROVIDER_SITE_OTHER): Payer: Self-pay | Admitting: Family Medicine

## 2018-08-16 IMAGING — CR DG HAND COMPLETE 3+V*R*
3 series · 3 of 3 positions shown · non-contrast
Comparison: None.

CLINICAL DATA: Struck hand against refrigerator

EXAM:
RIGHT HAND - COMPLETE 3+ VIEW

[x hand pa right]
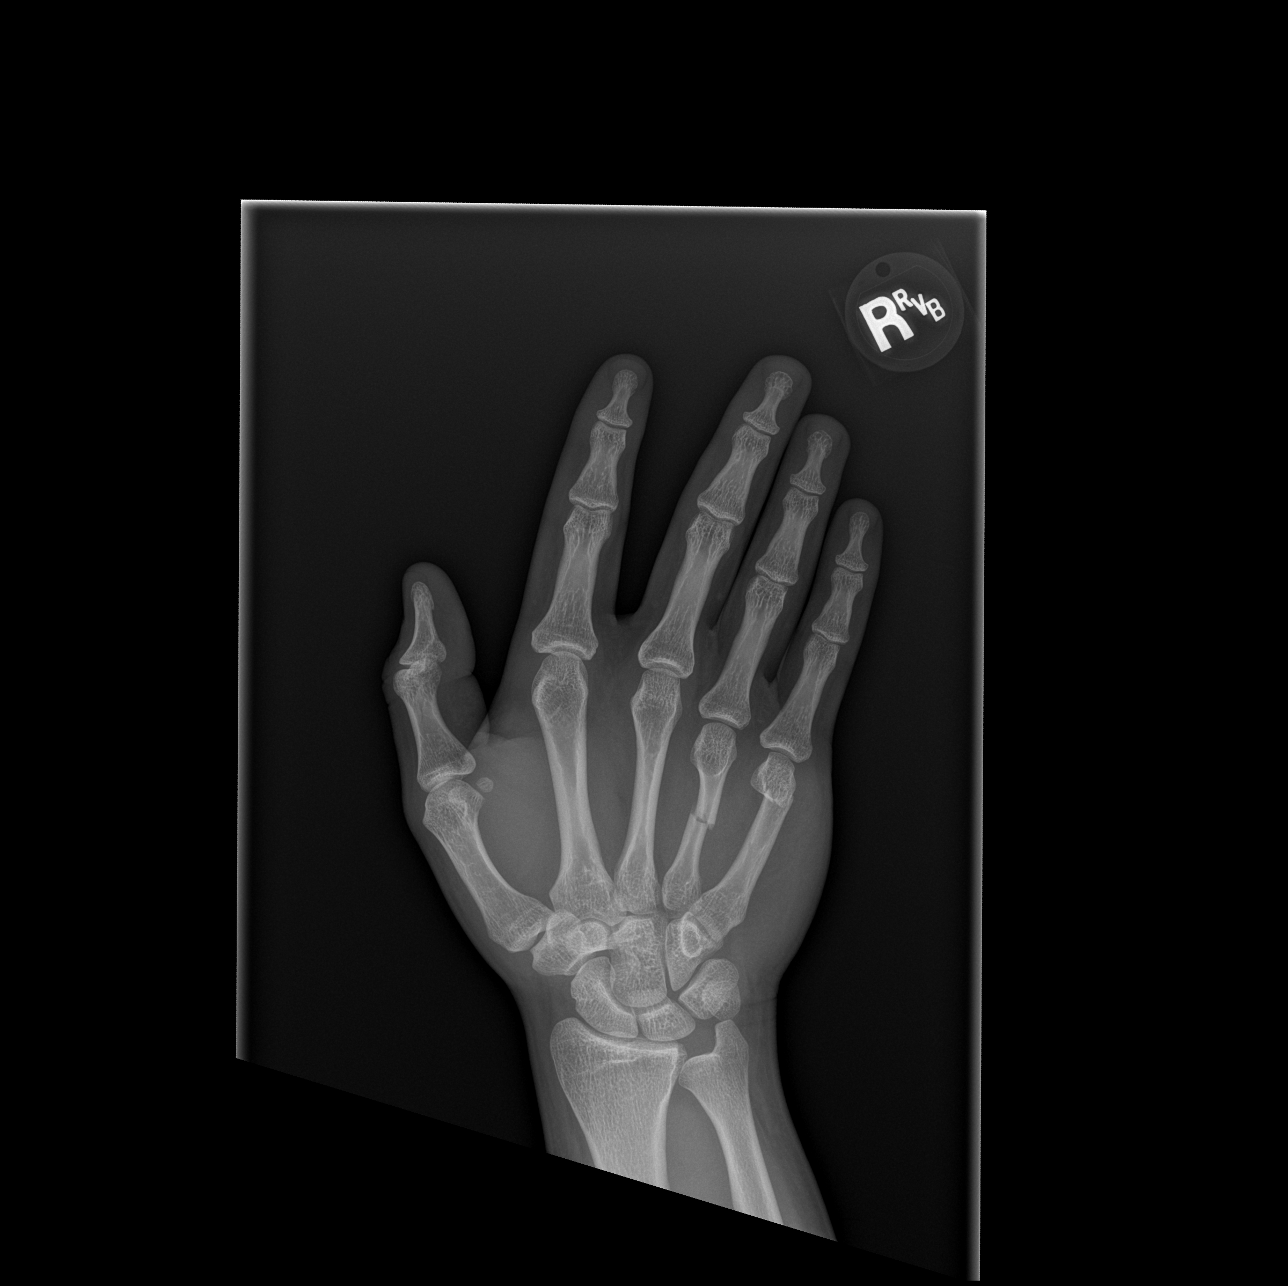

[x hand obl right]
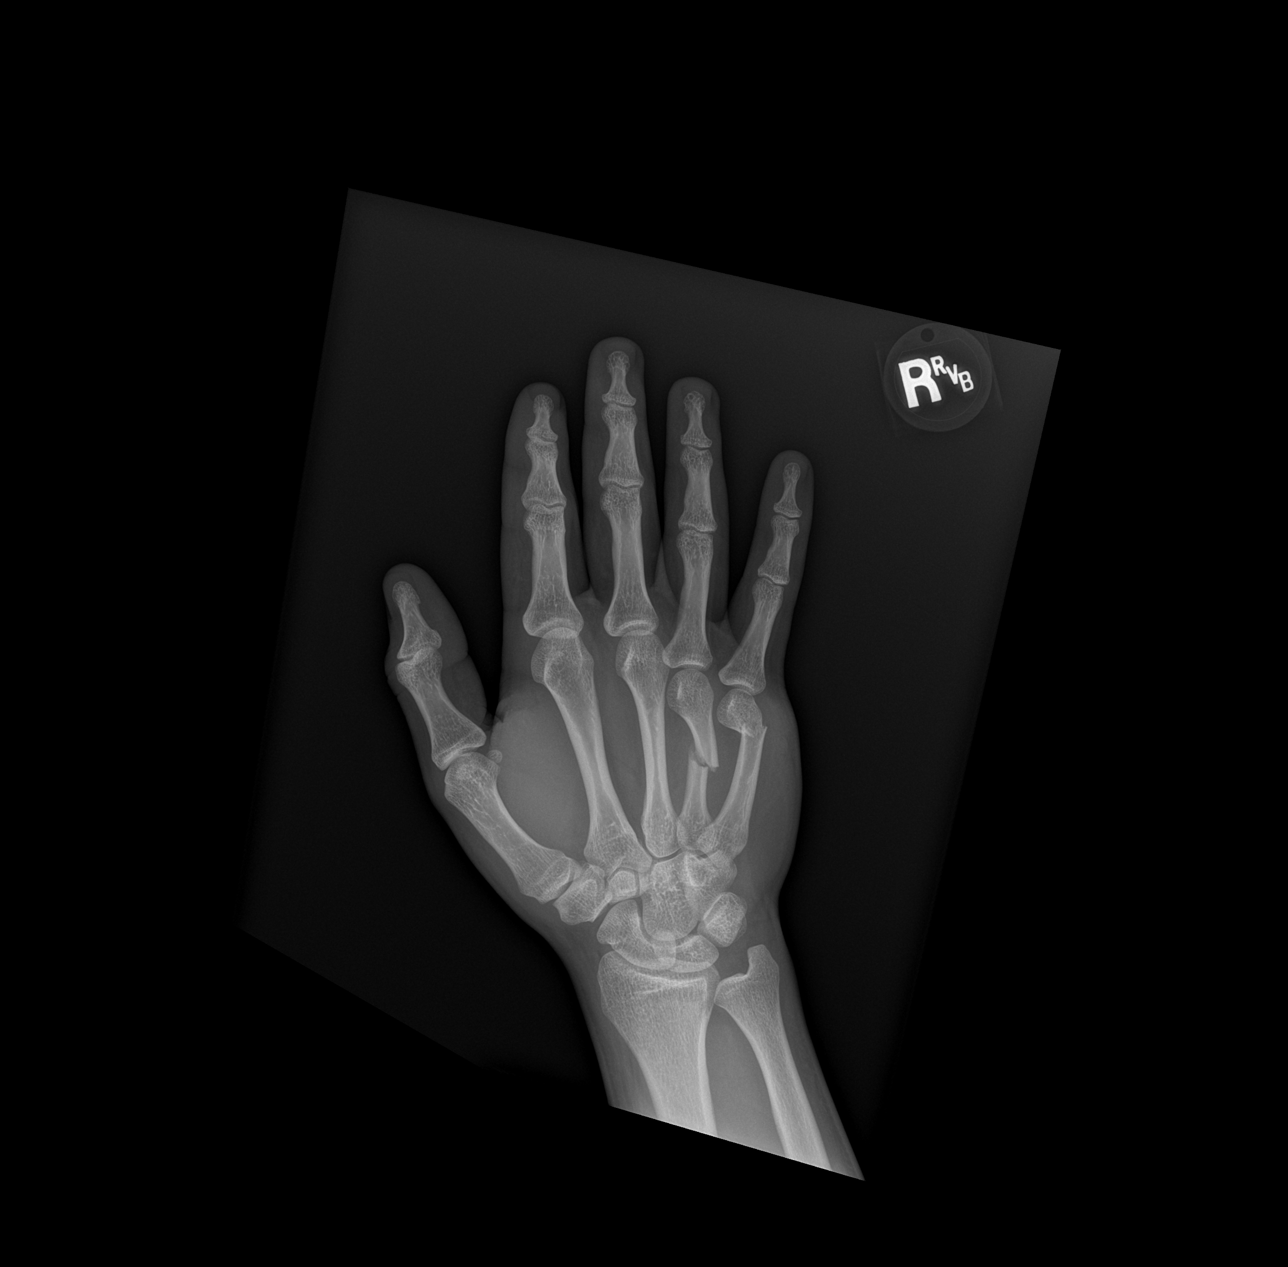

[x hand lat right]
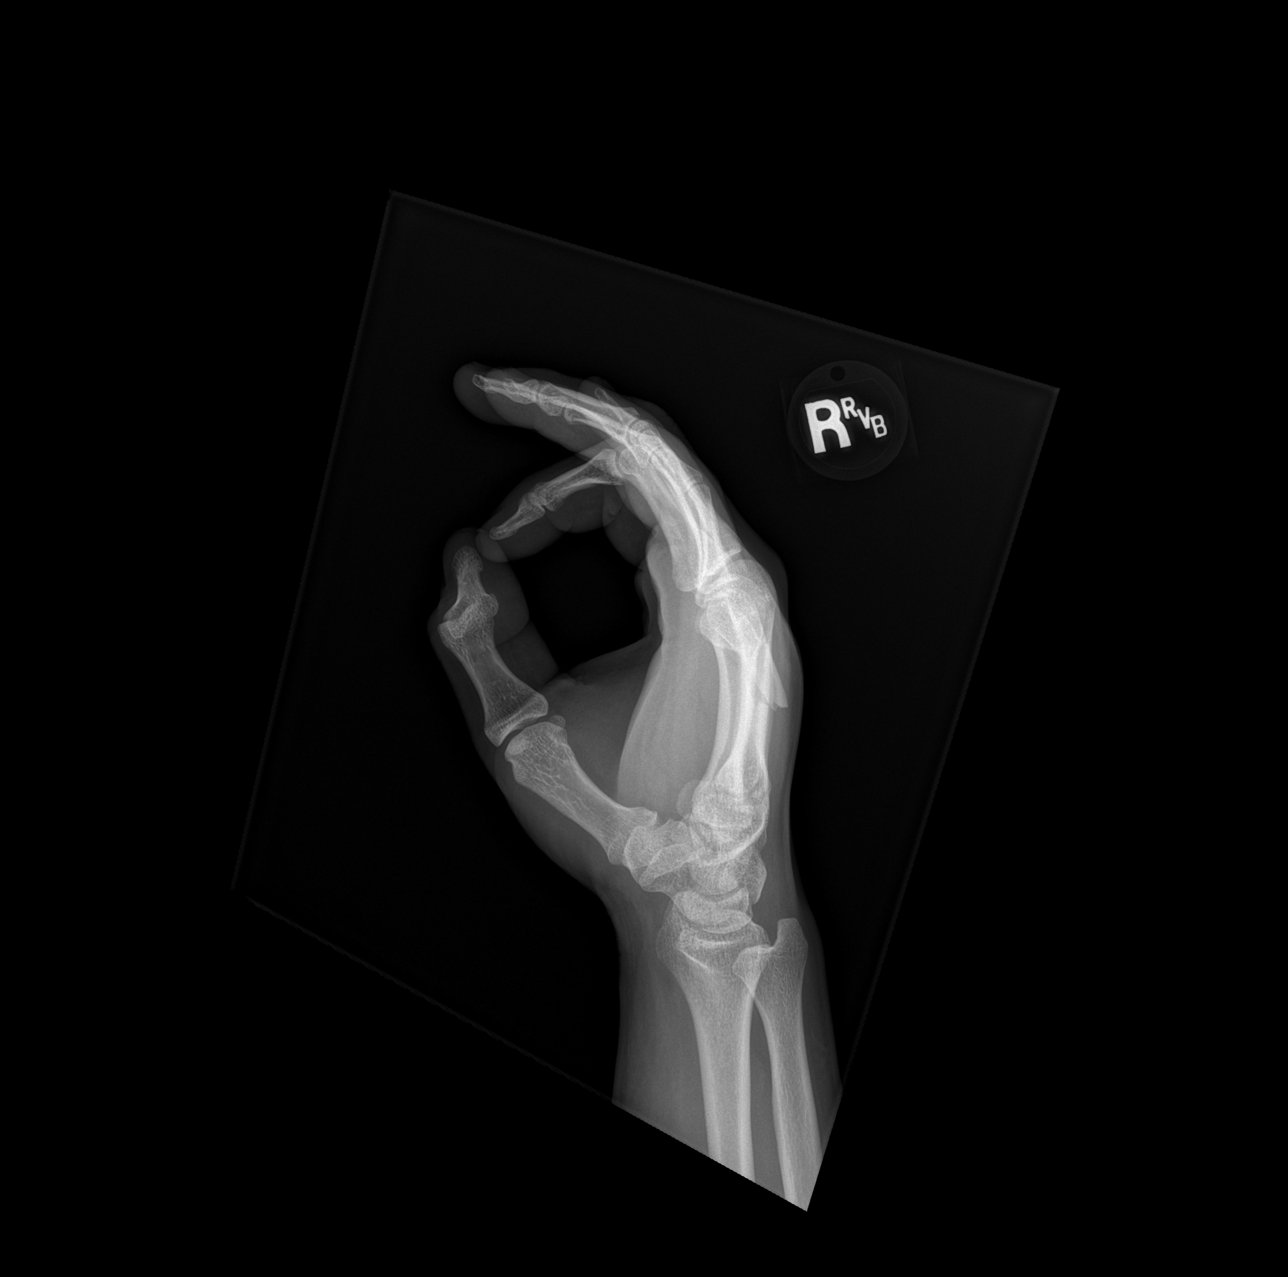

[3 of 3 positions shown; findings below may reference images not displayed]

FINDINGS: There is an acute fracture involving the midshaft of the fourth
metacarpal bone. There is also an acute fracture involving the
distal aspect of the fifth metacarpal bone. Dorsal and radial
angulation of the distal fracture fragments identified.
IMPRESSION: 1. Acute fractures involve the fourth and fifth metacarpal bones
with dorsal and radial angulation of the distal fracture fragments.

## 2018-12-14 ENCOUNTER — Other Ambulatory Visit: Payer: Self-pay

## 2018-12-14 DIAGNOSIS — Z20822 Contact with and (suspected) exposure to covid-19: Secondary | ICD-10-CM

## 2018-12-15 LAB — NOVEL CORONAVIRUS, NAA: SARS-CoV-2, NAA: NOT DETECTED

## 2019-12-09 IMAGING — CR DG SHOULDER 2+V*R*
3 series · 3 of 3 positions shown · non-contrast
Comparison: None.

CLINICAL DATA: Right shoulder deformity and dislocation 1 hour
prior to presentation.

EXAM:
RIGHT SHOULDER - 2+ VIEW

[w shoulder external right (1 of 2)]
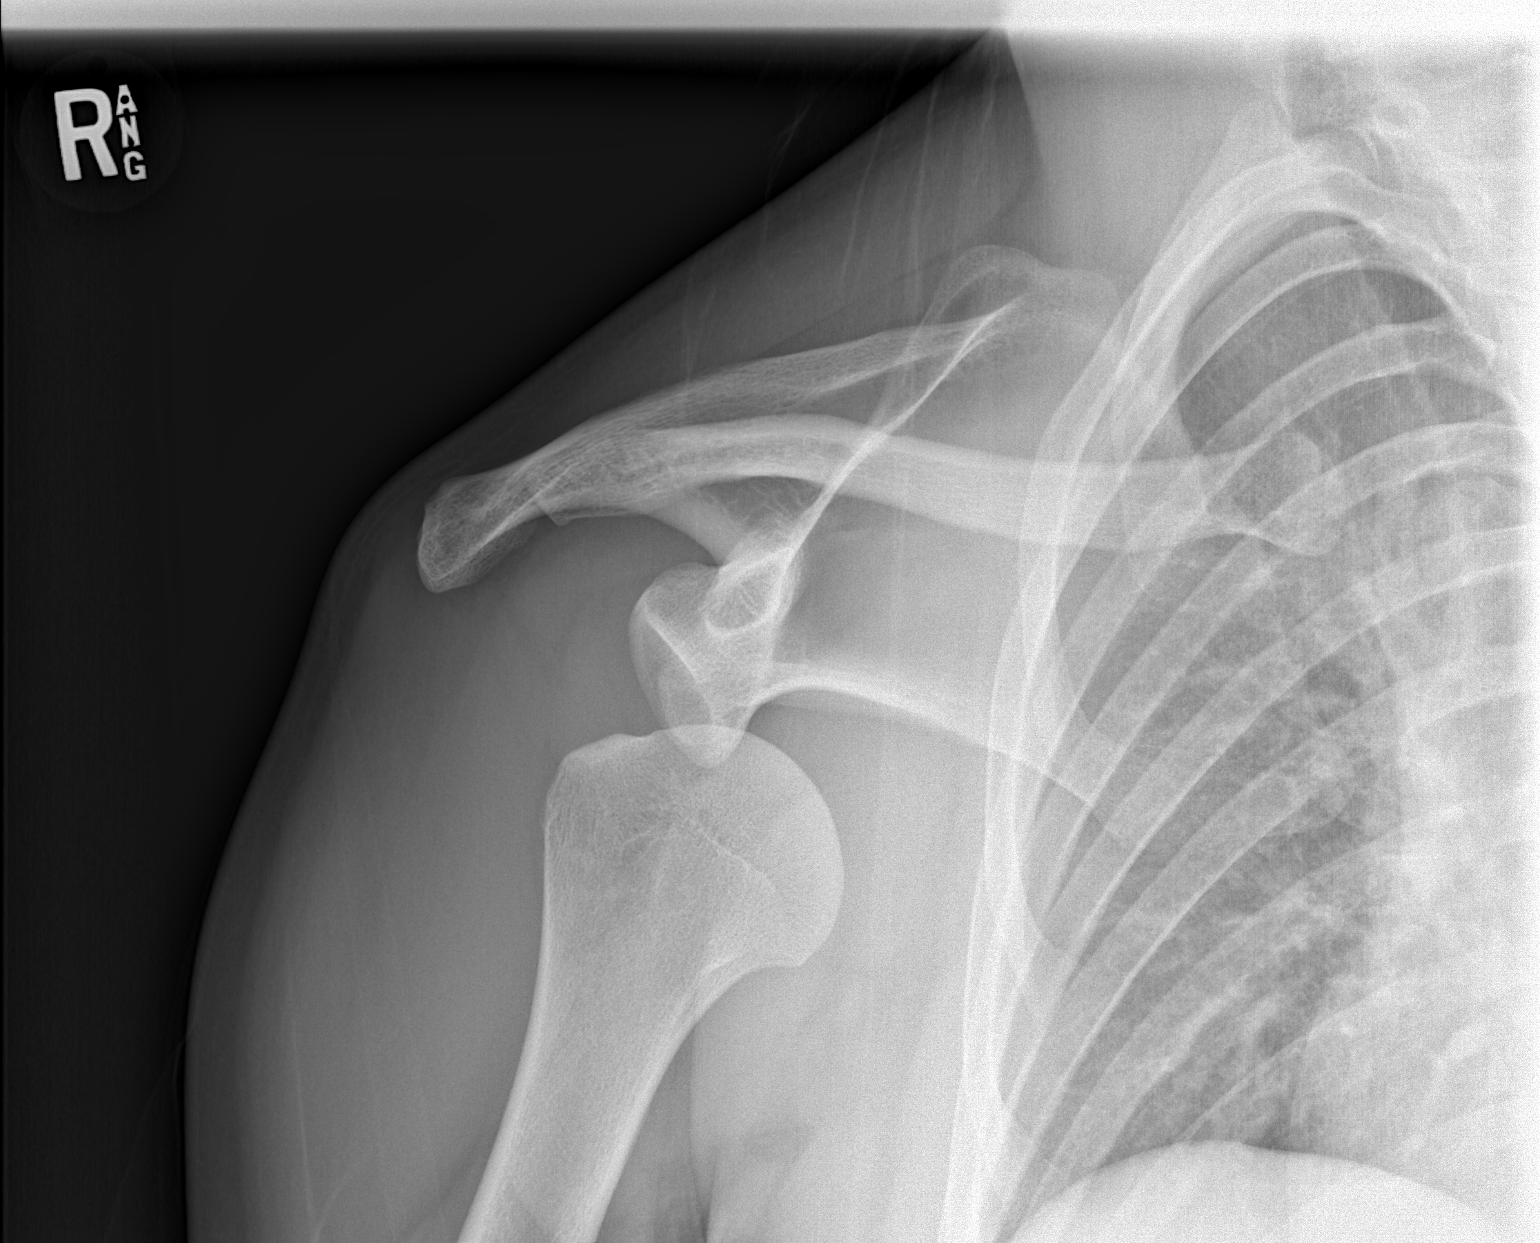

[w shoulder external right (2 of 2)]
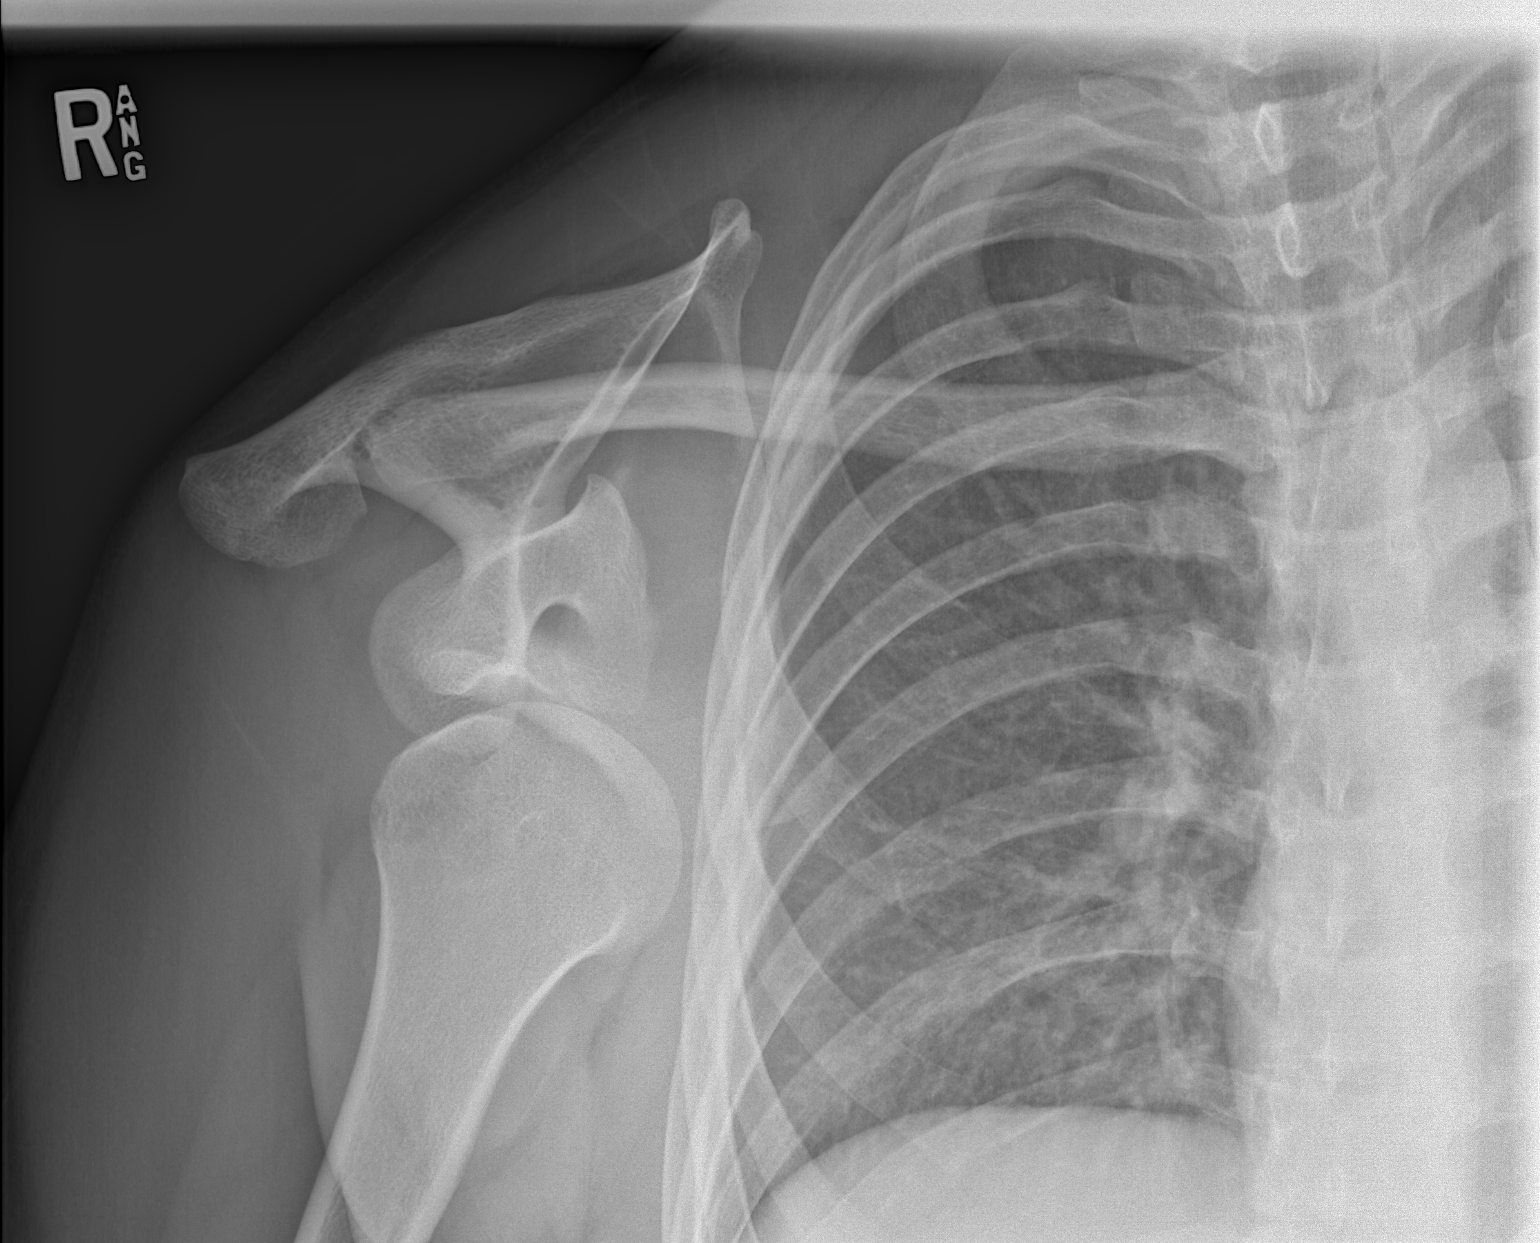

[w shoulder y-view right]
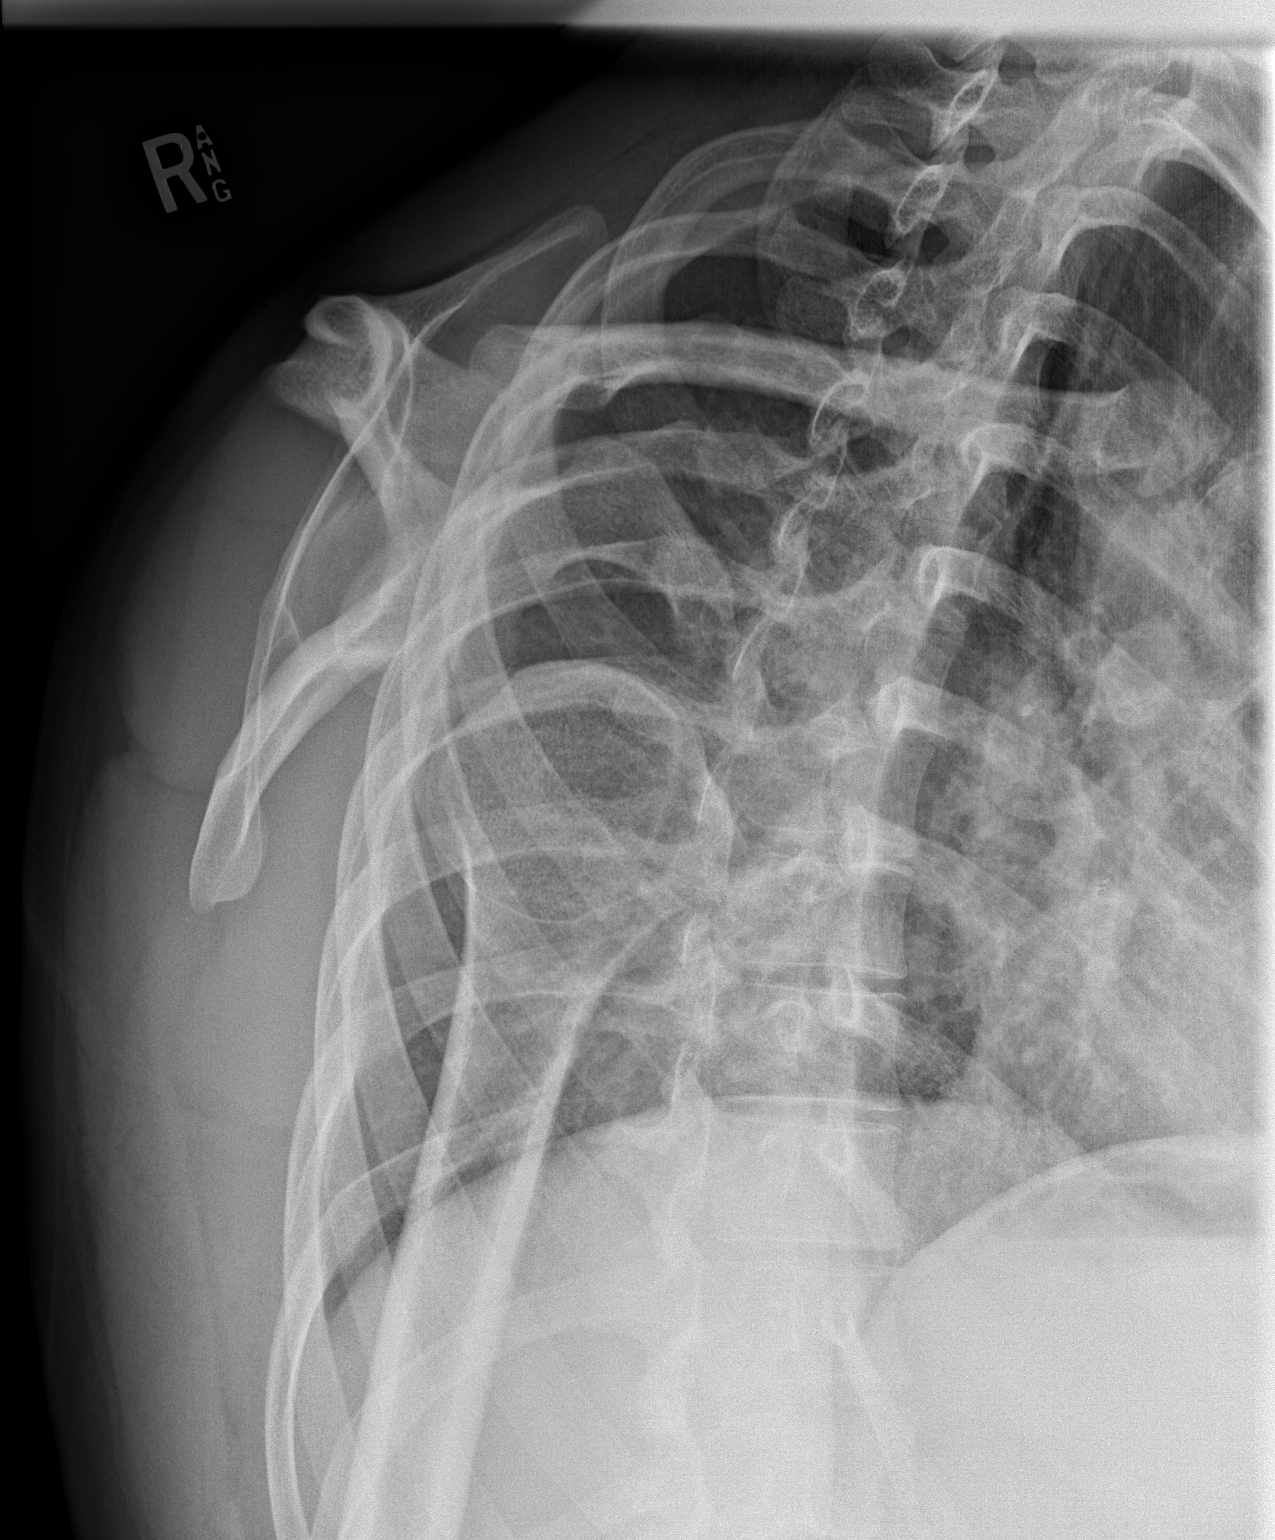

[3 of 3 positions shown; findings below may reference images not displayed]

FINDINGS: There is anterior inferior dislocation of the humeral head with
respect to the glenoid. No definite Hill-Sachs impaction or bony
Bankart injury.
IMPRESSION: 1. Anterior shoulder dislocation without appreciable Hill-Sachs
impaction.

## 2021-01-24 ENCOUNTER — Ambulatory Visit
Admission: EM | Admit: 2021-01-24 | Discharge: 2021-01-24 | Disposition: A | Discharge status: Home / Self Care | Payer: Medicaid Other | Attending: Emergency Medicine | Admitting: Emergency Medicine

## 2021-01-24 ENCOUNTER — Encounter: Payer: Self-pay | Admitting: Emergency Medicine

## 2021-01-24 DIAGNOSIS — J101 Influenza due to other identified influenza virus with other respiratory manifestations: Secondary | ICD-10-CM

## 2021-01-24 LAB — POCT INFLUENZA A/B
Influenza A, POC: POSITIVE — AB
Influenza B, POC: NEGATIVE

## 2021-01-24 MED ORDER — OSELTAMIVIR PHOSPHATE 75 MG PO CAPS: 75.0000 mg | ORAL_CAPSULE | Freq: Two times a day (BID) | ORAL | 0 refills | Status: AC

## 2021-01-24 NOTE — Discharge Instructions (Addendum)
You have been diagnosed with influenza. Take Tamiflu as directed. Tamiflu can be helpful by shortening the length of time you have symptoms by 1 - 2 days. Consider taking with probiotics for gut health as this medication can cause diarrhea and upset stomach.    Always read the labels of cough and cold medications as they may contain some of the ingredients below.  Rest, push lots of fluids (especially water), and utilize supportive care for symptoms. You may take acetaminophen (Tylenol) every 4-6 hours and ibuprofen every 6-8 hours for muscle pain, joint pain, headaches (you may also alternate these medications). Mucinex (guaifenesin) may be taken over the counter for cough as needed can loosen phlegm. Please read the instructions and take as directed.  Sudafed (pseudophedrine) is sold behind the counter and can help reduce nasal pressure; avoid taking this if you have high blood pressure or feel jittery. Sudafed PE (phenylephrine) can be a helpful, short-term, over-the-counter alternative to limit side effects or if you have high blood pressure.  Flonase nasal spray can help alleviate congestion and sinus pressure. Many patients choose Afrin as a nasal decongestant; do not use for more than 3 days for risk of rebound (increased symptoms after stopping medication).  Saline nasal sprays or rinses can also help nasal congestion (use bottled or sterile water). Warm tea with lemon and honey can sooth sore throat and cough, as can cough drops.   Return to clinic for high fever not improving with medications, chest pain, difficulty breathing, non-stop vomiting, or coughing blood. Follow-up with your primary care provider if symptoms do not improve as expected in the next 5-7 days.  

## 2021-01-24 NOTE — ED Triage Notes (Signed)
Pt here with flu-like sx since yesterday. Daughter is flu +

## 2021-01-24 NOTE — ED Provider Notes (Signed)
Name: Evan Deleon Address: [address] [place] [address] MRN: [id number] DOB: [birth year] Age: 25 y.o. Gender: male Encounter Date: 01/24/2021 Primary Provider: Julieanne Manson, MD  S: This is a 25 y.o. male with a 1-2 day history of flulike symptoms   +Myalgias and arthralgia.  +fatigue  + cough  + nasal congestion w/clear rhinorrhea  - fever;  - vomiting or diarrhea  Flu exposure: Daughter is +   The following portions of the patient's history were reviewed and updated in Epic as appropriate: allergies, current medications, past medical history, past social history, past surgical history and problem list.   O:   Vitals:   01/24/21 1805  BP: 129/65  Pulse: 96  Resp: 18  Temp: 98.3 F (36.8 C)  SpO2: 95%   Gen: WDWN, NAD   HEENT:NCAT, EOMI, EACs clear, TMS clear bilaterally  Nares without discharge; +moderate mucosal erythema and edema  OP moist with mild to moderate posterior cobblestoning, no lesions noted Neck:  Supple, shotty anterior cervical lymphadenopathy  Chest: lungs clear to auscultation bilaterally, normal respiratory effort CV:    RRR, no murmur  Skin:  no rash Psychiatric: appropriate demeanor and responsiveness   Rapid flu: +  ASSESSMENT: Influenza A  PLAN:Tamiflu 75mg   bid x 5 days  Symptomatic care with tylenol/motrin for pain or fevers;   Increased fluids as tolerated; humidifier use qhs prn   No work/school until 24 hours fever free  F/u with PCP if worsening, or is not improving    , FNP 01/24/21 1826

## 2021-03-17 ENCOUNTER — Ambulatory Visit: Payer: Self-pay

## 2021-04-03 ENCOUNTER — Ambulatory Visit: Payer: Self-pay

## 2021-04-03 ENCOUNTER — Other Ambulatory Visit: Payer: Self-pay

## 2021-04-03 ENCOUNTER — Other Ambulatory Visit: Payer: Self-pay | Admitting: Occupational Medicine

## 2021-04-03 DIAGNOSIS — Z0000 Encounter for general adult medical examination without abnormal findings: Secondary | ICD-10-CM

## 2022-05-27 ENCOUNTER — Ambulatory Visit
Admission: RE | Admit: 2022-05-27 | Discharge: 2022-05-27 | Disposition: A | Discharge status: Home / Self Care | Payer: 59 | Source: Ambulatory Visit | Attending: Nurse Practitioner | Admitting: Nurse Practitioner

## 2022-05-27 VITALS — BP 131/70 | HR 63 | Temp 97.7°F | Resp 18

## 2022-05-27 DIAGNOSIS — R112 Nausea with vomiting, unspecified: Secondary | ICD-10-CM

## 2022-05-27 MED ORDER — ONDANSETRON HCL 4 MG PO TABS: 4.0000 mg | ORAL_TABLET | Freq: Four times a day (QID) | ORAL | 0 refills | Status: DC

## 2022-05-27 NOTE — Discharge Instructions (Addendum)
You have been prescribed Zofran for the nausea and vomiting The diarrhea can be treated with Imodium as discussed.  Try bland diet as discussed with a bland diet

## 2022-05-27 NOTE — ED Triage Notes (Signed)
Nausea with diarrhea since Monday.  States itchy nose and sneezing since Saturday.  States nausea and diarrhea are only in the morning time.

## 2022-05-27 NOTE — ED Provider Notes (Signed)
[facility]    CSN: [id number] Arrival date & time: 05/27/22  1148      History   Chief Complaint Chief Complaint  Patient presents with   Abdominal Pain    Abdominal pain and vomiting - Entered by patient    HPI Evan Deleon is a 27 y.o. male.   HPI  Past Medical History:  Diagnosis Date   History of ADHD 12/14/2016   Oligodontia     Patient Active Problem List   Diagnosis Date Noted   History of ADHD 12/14/2016   Oligodontia     Past Surgical History:  Procedure Laterality Date   PERCUTANEOUS PINNING Right 09/29/2016   Procedure: PERCUTANEOUS PINNING EXTREMITY/RIGHT 4TH AND 5TH METACARPAL;  Surgeon: Milly Jakob, MD;  Location: [facility];  Service: Orthopedics;  Laterality: Right;       Home Medications    Prior to Admission medications   Medication Sig Start Date End Date Taking? Authorizing Provider  ondansetron (ZOFRAN) 4 MG tablet Take 1 tablet (4 mg total) by mouth every 6 (six) hours. 05/27/22  Yes Vevelyn Francois, NP  cetirizine (ZYRTEC) 10 MG tablet Take 1 tablet (10 mg total) by mouth daily. 12/14/16   Mack Hook, MD  ibuprofen (ADVIL,MOTRIN) 800 MG tablet Take 1 tablet (800 mg total) by mouth every 8 (eight) hours as needed for moderate pain. 01/22/18   Long, Wonda Olds, MD    Family History Family History  Family history unknown: Yes    Social History Social History   Tobacco Use   Smoking status: Former    Packs/day: 0.50    Years: 6.00    Total pack years: 3.00    Types: Cigarettes   Smokeless tobacco: Never  Vaping Use   Vaping Use: Every day   Substances: Nicotine  Substance Use Topics   Alcohol use: No   Drug use: Not Currently    Types: Marijuana     Allergies   Bee pollen, Peanuts [peanut oil], and Pollen extract   Review of Systems Review of Systems   Physical Exam Triage Vital Signs ED Triage Vitals  Enc Vitals Group     BP 05/27/22 1154 131/70     Pulse Rate 05/27/22 1154 63     Resp  05/27/22 1154 18     Temp 05/27/22 1154 97.7 F (36.5 C)     Temp Source 05/27/22 1154 Oral     SpO2 05/27/22 1154 97 %     Weight --      Height --      Head Circumference --      Peak Flow --      Pain Score 05/27/22 1156 0     Pain Loc --      Pain Edu? --      Excl. in [place]? --    No data found.  Updated Vital Signs BP 131/70 (BP Location: Right Arm)   Pulse 63   Temp 97.7 F (36.5 C) (Oral)   Resp 18   SpO2 97%   Visual Acuity Right Eye Distance:   Left Eye Distance:   Bilateral Distance:    Right Eye Near:   Left Eye Near:    Bilateral Near:     Physical Exam Constitutional:      General: He is not in acute distress.    Appearance: He is normal weight. He is not ill-appearing, toxic-appearing or diaphoretic.  HENT:     Head: Normocephalic and atraumatic.  Cardiovascular:  Rate and Rhythm: Normal rate.     Heart sounds: Normal heart sounds.  Pulmonary:     Effort: Pulmonary effort is normal.  Abdominal:     General: Abdomen is flat. Bowel sounds are normal. There is no distension.     Palpations: Abdomen is soft.     Tenderness: There is no abdominal tenderness.     Hernia: No hernia is present.  Skin:    General: Skin is warm and dry.     Capillary Refill: Capillary refill takes less than 2 seconds.  Neurological:     General: No focal deficit present.     Mental Status: He is alert.  Psychiatric:        Mood and Affect: Mood normal.        Behavior: Behavior normal.      UC Treatments / Results  Labs (all labs ordered are listed, but only abnormal results are displayed) Labs Reviewed - No data to display  EKG   Radiology No results found.  Procedures Procedures (including critical care time)  Medications Ordered in UC Medications - No data to display  Initial Impression / Assessment and Plan / UC Course  I have reviewed the triage vital signs and the nursing notes.  Pertinent labs & imaging results that were available during my  care of the patient were reviewed by me and considered in my medical decision making (see chart for details).     Nausea and vomiting  Final Clinical Impressions(s) / UC Diagnoses   Final diagnoses:  Nausea and vomiting, unspecified vomiting type     Discharge Instructions      You have been prescribed Zofran for the nausea and vomiting The diarrhea can be treated with Imodium as discussed.  Try bland diet as discussed with a bland diet       ED Prescriptions     Medication Sig Dispense Auth. Provider   ondansetron (ZOFRAN) 4 MG tablet Take 1 tablet (4 mg total) by mouth every 6 (six) hours. 12 tablet Vevelyn Francois, NP      PDMP not reviewed this encounter.   Vevelyn Francois, NP 05/27/22 1224

## 2022-08-08 ENCOUNTER — Ambulatory Visit
Admission: EM | Admit: 2022-08-08 | Discharge: 2022-08-08 | Disposition: A | Discharge status: Home / Self Care | Payer: 59

## 2022-08-08 ENCOUNTER — Encounter: Payer: Self-pay | Admitting: Emergency Medicine

## 2022-08-08 DIAGNOSIS — R112 Nausea with vomiting, unspecified: Secondary | ICD-10-CM | POA: Diagnosis not present

## 2022-08-08 DIAGNOSIS — R197 Diarrhea, unspecified: Secondary | ICD-10-CM

## 2022-08-08 MED ORDER — LOPERAMIDE HCL 2 MG PO CAPS: 2.0000 mg | ORAL_CAPSULE | Freq: Four times a day (QID) | ORAL | 0 refills | Status: DC | PRN

## 2022-08-08 MED ORDER — ONDANSETRON 4 MG PO TBDP: 4.0000 mg | ORAL_TABLET | Freq: Three times a day (TID) | ORAL | 0 refills | Status: DC | PRN

## 2022-08-08 NOTE — ED Provider Notes (Signed)
[facility]    CSN: [id number] Arrival date & time: 08/08/22  1341      History   Chief Complaint Chief Complaint  Patient presents with   Abdominal Pain    Vomiting, diarrhea - Entered by patient    HPI Evan Deleon is a 27 y.o. male.   The history is provided by the patient.   Patient presents for complaints of nausea, vomiting, and diarrhea that started early this morning.  Patient states that he went to a [place] in which he could cook his own food.  He states that he undercooked his meat he believes.  Patient reports that symptoms started around 3 AM this morning.  States that he vomited from 3 AM to 6 AM, since that time, nausea and vomiting have improved, now he has diarrhea.  Reports 4 episodes of diarrhea.  Describes the diarrhea as "like water and its black".  Patient states that he does have mild abdominal pain he rates 3/4 out of 10.  Patient denies fever, respiratory symptoms, chest pain, constipation, bloody stools, gas, bloating, or urinary symptoms.  He states that he has not eaten today, but has drank fluids and has been able to keep them down. Past Medical History:  Diagnosis Date   History of ADHD 12/14/2016   Oligodontia     Patient Active Problem List   Diagnosis Date Noted   History of ADHD 12/14/2016   Oligodontia     Past Surgical History:  Procedure Laterality Date   PERCUTANEOUS PINNING Right 09/29/2016   Procedure: PERCUTANEOUS PINNING EXTREMITY/RIGHT 4TH AND 5TH METACARPAL;  Surgeon: Mack Hook, MD;  Location: [facility];  Service: Orthopedics;  Laterality: Right;       Home Medications    Prior to Admission medications   Medication Sig Start Date End Date Taking? Authorizing Provider  ARIPiprazole (ABILIFY) 2 MG tablet Take 2 mg by mouth daily.   Yes [provider]  buPROPion (WELLBUTRIN) 75 MG tablet Take 75 mg by mouth 2 (two) times daily.   Yes [provider]  cetirizine (ZYRTEC) 10 MG  tablet Take 1 tablet (10 mg total) by mouth daily. 12/14/16   Julieanne Manson, MD  ibuprofen (ADVIL,MOTRIN) 800 MG tablet Take 1 tablet (800 mg total) by mouth every 8 (eight) hours as needed for moderate pain. 01/22/18   Long, Arlyss Repress, MD    Family History Family History  Family history unknown: Yes    Social History Social History   Tobacco Use   Smoking status: Former    Packs/day: 0.50    Years: 6.00    Additional pack years: 0.00    Total pack years: 3.00    Types: Cigarettes   Smokeless tobacco: Never  Vaping Use   Vaping Use: Every day   Substances: Nicotine  Substance Use Topics   Alcohol use: No   Drug use: Not Currently    Types: Marijuana     Allergies   Bee pollen, Peanuts [peanut oil], and Pollen extract   Review of Systems Review of Systems Per HPI  Physical Exam Triage Vital Signs ED Triage Vitals  Enc Vitals Group     BP 08/08/22 1344 138/80     Pulse Rate 08/08/22 1344 73     Resp 08/08/22 1344 18     Temp 08/08/22 1344 98.2 F (36.8 C)     Temp Source 08/08/22 1344 Oral     SpO2 08/08/22 1344 96 %     Weight --  Height --      Head Circumference --      Peak Flow --      Pain Score 08/08/22 1345 3     Pain Loc --      Pain Edu? --      Excl. in [place]? --    No data found.  Updated Vital Signs BP 138/80 (BP Location: Right Arm)   Pulse 73   Temp 98.2 F (36.8 C) (Oral)   Resp 18   SpO2 96%   Visual Acuity Right Eye Distance:   Left Eye Distance:   Bilateral Distance:    Right Eye Near:   Left Eye Near:    Bilateral Near:     Physical Exam Vitals and nursing note reviewed.  Constitutional:      General: He is not in acute distress.    Appearance: He is well-developed.  HENT:     Head: Normocephalic.     Mouth/Throat:     Mouth: Mucous membranes are moist.  Eyes:     Extraocular Movements: Extraocular movements intact.     Pupils: Pupils are equal, round, and reactive to light.  Cardiovascular:     Rate and  Rhythm: Normal rate and regular rhythm.     Heart sounds: Normal heart sounds.  Pulmonary:     Effort: Pulmonary effort is normal. No respiratory distress.     Breath sounds: Normal breath sounds. No stridor. No wheezing, rhonchi or rales.  Abdominal:     General: Abdomen is flat. Bowel sounds are normal.     Palpations: Abdomen is soft.     Tenderness: There is no abdominal tenderness.  Skin:    General: Skin is warm and dry.  Neurological:     Mental Status: He is alert and oriented to person, place, and time.  Psychiatric:        Mood and Affect: Mood normal.        Behavior: Behavior normal.      UC Treatments / Results  Labs (all labs ordered are listed, but only abnormal results are displayed) Labs Reviewed - No data to display  EKG   Radiology No results found.  Procedures Procedures (including critical care time)  Medications Ordered in UC Medications - No data to display  Initial Impression / Assessment and Plan / UC Course  I have reviewed the triage vital signs and the nursing notes.  Pertinent labs & imaging results that were available during my care of the patient were reviewed by me and considered in my medical decision making (see chart for details).  The patient is well-appearing, he is in no acute distress, vital signs are stable.  Differential diagnoses include gastroenteritis and food poisoning.  Will treat patient with Zofran 4 mg for nausea and vomiting, and Imodium A-D  2mg  to help with his diarrhea.  Supportive care recommendations were provided and discussed with the patient to include increasing his fluid intake, recommending Pedialyte or Gatorade or lites as needed, over-the-counter analgesics for pain or discomfort, and a step up diet until symptoms improve.  Patient was given strict ER follow-up precautions.  Patient is in agreement with this plan of care and verbalizes understanding.  All questions were answered.  Patient stable for  discharge.   Final Clinical Impressions(s) / UC Diagnoses   Final diagnoses:  None   Discharge Instructions   None    ED Prescriptions   None    PDMP not reviewed this encounter.   Amad Mau-Warren,  Sadie Haber, NP 08/08/22 1406

## 2022-08-08 NOTE — Discharge Instructions (Addendum)
Take medication as prescribed. Increase fluids and offer plenty of rest.  Try to drink at least 8-10 8 ounce glasses of water while symptoms persist.  You can also drink Pedialyte or Gatorolyte to prevent dehydration. Recommend a bland diet until symptoms improve.  This includes soup, broth, Jell-O, popsicles, or ginger ale.  If you are able to tolerate this, you can advance to a soft diet with soft meats vegetables, if you are able to tolerate that, you can advance to regular diet.  Patient also tried to eat foods that add bulk to her stool.  I have provided patient for you to review. As discussed, if you experience worsening nausea, vomiting, or diarrhea, despite taking medication, along with new symptoms of fever, chills, worsening abdominal pain, I would like you to go to the emergency department immediately for further evaluation. Follow-up as needed.

## 2022-08-08 NOTE — ED Triage Notes (Signed)
Vomiting and diarrhea started this morning.  Thinks he ate under cooked meat last night.  Needs a work note to return to work.

## 2022-12-03 ENCOUNTER — Telehealth: Payer: 59 | Admitting: Physician Assistant

## 2022-12-03 ENCOUNTER — Ambulatory Visit: Payer: Self-pay

## 2022-12-03 DIAGNOSIS — A084 Viral intestinal infection, unspecified: Secondary | ICD-10-CM

## 2022-12-03 NOTE — Patient Instructions (Signed)
Verlin Fester, thank you for joining Piedad Climes, PA-C for today's virtual visit.  While this provider is not your primary care provider (PCP), if your PCP is located in our provider database this encounter information will be shared with them immediately following your visit.   A [facility] MyChart account gives you access to today's visit and all your visits, tests, and labs performed at [facility] " click here if you don't have a [facility] MyChart account or go to [email or web address]  Consent: (Patient) Evan Deleon provided verbal consent for this virtual visit at the beginning of the encounter.  Current Medications:  Current Outpatient Medications:    ARIPiprazole (ABILIFY) 2 MG tablet, Take 2 mg by mouth daily., Disp: , Rfl:    buPROPion (WELLBUTRIN) 75 MG tablet, Take 75 mg by mouth 2 (two) times daily., Disp: , Rfl:    cetirizine (ZYRTEC) 10 MG tablet, Take 1 tablet (10 mg total) by mouth daily., Disp: 30 tablet, Rfl: 11   ibuprofen (ADVIL,MOTRIN) 800 MG tablet, Take 1 tablet (800 mg total) by mouth every 8 (eight) hours as needed for moderate pain., Disp: 21 tablet, Rfl: 0   loperamide (IMODIUM) 2 MG capsule, Take 1 capsule (2 mg total) by mouth 4 (four) times daily as needed for diarrhea or loose stools. Do not exceed 8 mg in a 24-hour period., Disp: 12 capsule, Rfl: 0   ondansetron (ZOFRAN-ODT) 4 MG disintegrating tablet, Take 1 tablet (4 mg total) by mouth every 8 (eight) hours as needed for nausea or vomiting., Disp: 20 tablet, Rfl: 0   Medications ordered in this encounter:  No orders of the defined types were placed in this encounter.    *If you need refills on other medications prior to your next appointment, please contact your pharmacy*  Follow-Up: Call back or seek an in-person evaluation if the symptoms worsen or if the condition fails to improve as anticipated.  [facility] [phone]  Other Instructions Food Choices  to Help Relieve Diarrhea, Adult Diarrhea can make you feel weak and cause you to become dehydrated. Dehydration is a condition in which there is not enough water or other fluids in the body. It is important to choose the right foods and drinks to: Relieve diarrhea. Replace lost fluids and nutrients. Prevent dehydration. What are tips for following this plan? Relieving diarrhea Avoid foods that make your diarrhea worse. These may include: Foods and drinks that are sweetened with high-fructose corn syrup, honey, or sweeteners such as xylitol, sorbitol, and mannitol. Check food labels for these ingredients. Fried, greasy, or spicy foods. Raw fruits and vegetables. Eat foods that are rich in probiotics. These include foods such as yogurt and fermented milk products. Probiotics can help increase healthy bacteria in your stomach and intestines (gastrointestinal or GI tract). This may help digestion and stop diarrhea. If you have lactose intolerance, avoid dairy products. These may make your diarrhea worse. Take medicine to help stop diarrhea only as told by your health care provider. Replacing nutrients  Eat bland, easy-to-digest foods in small amounts as you are able, until your diarrhea starts to get better. These foods include bananas, applesauce, rice, toast, and crackers. Over time, add nutrient-rich foods as your body tolerates them or as told by your health care provider. These include: Well-cooked protein foods, such as eggs, lean meats like fish or chicken without skin, and tofu. Peeled, seeded, and soft-cooked fruits and vegetables. Low-fat dairy products. Whole grains. Take vitamin and mineral supplements  as told by your health care provider. Preventing dehydration  Start by sipping water or a solution to prevent dehydration (oral rehydration solution, or ORS). This is a drink that helps replace fluids and minerals your body has lost. You can buy an ORS at pharmacies and retail  stores. Try to drink at least 8-10 cups (2,000-2,500 mL) of fluid each day to help replace lost fluids. If your urine is pale yellow, you are getting enough fluids. You may drink other liquids in addition to water, such as fruit juice that you have added water to (diluted fruit juice) or low-calorie sports drinks, as tolerated or as told by your health care provider. Avoid drinks with caffeine, such as coffee, tea, or soft drinks. Avoid alcohol. This information is not intended to replace advice given to you by your health care provider. Make sure you discuss any questions you have with your health care provider. Document Revised: 08/19/2021 Document Reviewed: 08/19/2021 Elsevier Patient Education  2024 Elsevier Inc.    If you have been instructed to have an in-person evaluation today at a local Urgent Care facility, please use the link below. It will take you to a list of all of our available [facility], including address, phone number and hours of operation. Please do not delay care.  [facility]  If you or a family member do not have a primary care provider, use the link below to schedule a visit and establish care. When you choose a [facility] primary care physician or advanced practice provider, you gain a long-term partner in health. Find a Primary Care Provider  Learn more about [facility]'s in-office and virtual care options: [facility] - Get Care Now

## 2022-12-03 NOTE — Progress Notes (Signed)
Virtual Visit Consent   Evan Deleon, you are scheduled for a virtual visit with a [facility] provider today. Just as with appointments in the office, your consent must be obtained to participate. Your consent will be active for this visit and any virtual visit you may have with one of our providers in the next 365 days. If you have a MyChart account, a copy of this consent can be sent to you electronically.  As this is a virtual visit, video technology does not allow for your provider to perform a traditional examination. This may limit your provider's ability to fully assess your condition. If your provider identifies any concerns that need to be evaluated in person or the need to arrange testing (such as labs, EKG, etc.), we will make arrangements to do so. Although advances in technology are sophisticated, we cannot ensure that it will always work on either your end or our end. If the connection with a video visit is poor, the visit may have to be switched to a telephone visit. With either a video or telephone visit, we are not always able to ensure that we have a secure connection.  By engaging in this virtual visit, you consent to the provision of healthcare and authorize for your insurance to be billed (if applicable) for the services provided during this visit. Depending on your insurance coverage, you may receive a charge related to this service.  I need to obtain your verbal consent now. Are you willing to proceed with your visit today? Evan Deleon has provided verbal consent on 12/03/2022 for a virtual visit (video or telephone). Evan Deleon, [place]  Date: 12/03/2022 5:27 PM  Virtual Visit via Video Note   I, Evan Deleon, connected with  Evan Deleon  ([id number], 1995/12/14) on 12/03/22 at  5:30 PM EDT by a video-enabled telemedicine application and verified that I am speaking with the correct person using two identifiers.  Location: Patient: Virtual Visit Location  Patient: Home Provider: Virtual Visit Location Provider: Home Office   I discussed the limitations of evaluation and management by telemedicine and the availability of in person appointments. The patient expressed understanding and agreed to proceed.    History of Present Illness: Evan Deleon is a 27 y.o. who identifies as a male who was assigned male at birth, and is being seen today for GI symptoms starting overnight into this morning with sweating, cramping and loose stools. Since then notes resolution of abdominal pain but occasional loose stool. Denies fever, chills. Denies emesis. Denies recent travel or sick contact.  Needs work note.   HPI: HPI  Problems:  Patient Active Problem List   Diagnosis Date Noted   History of ADHD 12/14/2016   Oligodontia     Allergies:  Allergies  Allergen Reactions   Bee Pollen Other (See Comments)    sneezing   Peanuts [Peanut Oil] Swelling    Black walnuts Facial swelling   Pollen Extract Other (See Comments)    sneezing   Medications: No current outpatient medications on file.  Observations/Objective: Patient is well-developed, well-nourished in no acute distress.  Resting comfortably at home.  Head is normocephalic, atraumatic.  No labored breathing. Speech is clear and coherent with logical content.  Patient is alert and oriented at baseline.   Assessment and Plan: 1. Viral gastroenteritis  Improving already. Afebrile. No emesis. Hydrating well.  Supportive measures and OTC medications reviewed. Start [employer]  Work note provided. Strict return precautions discussed with patient.  Follow  Up Instructions: I discussed the assessment and treatment plan with the patient. The patient was provided an opportunity to ask questions and all were answered. The patient agreed with the plan and demonstrated an understanding of the instructions.  A copy of instructions were sent to the patient via MyChart unless otherwise noted below.    The patient was advised to call back or seek an in-person evaluation if the symptoms worsen or if the condition fails to improve as anticipated.  Time:  I spent 10 minutes with the patient via telehealth technology discussing the above problems/concerns.    Evan Climes, PA-C

## 2023-03-30 ENCOUNTER — Ambulatory Visit: Payer: Self-pay

## 2024-03-08 ENCOUNTER — Ambulatory Visit: Payer: Self-pay
